# Patient Record
Sex: Female | Born: 1990
Health system: Southern US, Community
[De-identification: ages and names within clinical notes are randomized; demographics above are authoritative.]

## PROBLEM LIST (undated history)

## (undated) DIAGNOSIS — N390 Urinary tract infection, site not specified: Secondary | ICD-10-CM

## (undated) DIAGNOSIS — O4400 Placenta previa specified as without hemorrhage, unspecified trimester: Secondary | ICD-10-CM

## (undated) DIAGNOSIS — F419 Anxiety disorder, unspecified: Secondary | ICD-10-CM

## (undated) DIAGNOSIS — J302 Other seasonal allergic rhinitis: Secondary | ICD-10-CM

## (undated) DIAGNOSIS — M419 Scoliosis, unspecified: Secondary | ICD-10-CM

## (undated) DIAGNOSIS — F32A Depression, unspecified: Secondary | ICD-10-CM

## (undated) HISTORY — DX: Scoliosis, unspecified: M41.9

## (undated) HISTORY — PX: WISDOM TOOTH EXTRACTION: SHX21

## (undated) HISTORY — DX: Other seasonal allergic rhinitis: J30.2

## (undated) HISTORY — DX: Depression, unspecified: F32.A

## (undated) HISTORY — DX: Anxiety disorder, unspecified: F41.9

## (undated) HISTORY — DX: Urinary tract infection, site not specified: N39.0

---

## 2014-04-12 HISTORY — PX: SPINAL FUSION: SHX223

## 2016-07-01 ENCOUNTER — Telehealth: Payer: Self-pay | Admitting: Behavioral Health

## 2016-07-01 NOTE — Telephone Encounter (Signed)
Patient voiced that she was driving at this time and unsure of some things like medications. She will be bringing in the new patient packet tomorrow at her appointment.

## 2016-07-02 ENCOUNTER — Encounter: Payer: Self-pay | Admitting: Family Medicine

## 2016-07-02 ENCOUNTER — Ambulatory Visit (INDEPENDENT_AMBULATORY_CARE_PROVIDER_SITE_OTHER): Payer: Managed Care, Other (non HMO) | Admitting: Family Medicine

## 2016-07-02 VITALS — BP 115/73 | HR 82 | Temp 98.2°F | Ht 65.25 in | Wt 147.0 lb

## 2016-07-02 DIAGNOSIS — Z711 Person with feared health complaint in whom no diagnosis is made: Secondary | ICD-10-CM | POA: Diagnosis not present

## 2016-07-02 DIAGNOSIS — G8929 Other chronic pain: Secondary | ICD-10-CM | POA: Diagnosis not present

## 2016-07-02 DIAGNOSIS — Z9889 Other specified postprocedural states: Secondary | ICD-10-CM

## 2016-07-02 DIAGNOSIS — M546 Pain in thoracic spine: Secondary | ICD-10-CM | POA: Diagnosis not present
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
## 2016-07-02 NOTE — Patient Instructions (Signed)
I will refer you to [facility] ([facility]) to give an opinion about your back. Please see if you can get your MRI and recent x-rays on a CD prior to your appointment At this time I do not see anything of concern in your breast, but certainly keep an eye on it. It if seems to be changing or otherwise has you concerned I am order imaging of the area  Take care and nice to meet you today!

## 2016-09-04 ENCOUNTER — Encounter: Payer: Self-pay | Admitting: Physical Medicine & Rehabilitation

## 2016-09-22 ENCOUNTER — Telehealth: Payer: Self-pay | Admitting: Family Medicine

## 2016-09-22 MED ORDER — PANTOPRAZOLE SODIUM 20 MG PO TBEC: 20.0000 mg | DELAYED_RELEASE_TABLET | Freq: Every day | ORAL | 5 refills | Status: DC

## 2016-09-22 MED ORDER — LEVONORGEST-ETH ESTRAD 91-DAY 0.15-0.03 MG PO TABS: 1.0000 | ORAL_TABLET | Freq: Every day | ORAL | 3 refills | Status: DC

## 2016-09-22 MED ORDER — TRAMADOL HCL 50 MG PO TABS: 50.0000 mg | ORAL_TABLET | Freq: Two times a day (BID) | ORAL | 2 refills | Status: DC | PRN

## 2016-09-22 MED ORDER — METHOCARBAMOL 750 MG PO TABS: 750.0000 mg | ORAL_TABLET | Freq: Three times a day (TID) | ORAL | 5 refills | Status: DC

## 2016-09-22 NOTE — Telephone Encounter (Signed)
   traMADol, pantoprazole, levonorgestrel  Last seen 07/02/16 Never filled by you  Please advise PC

## 2016-09-22 NOTE — Telephone Encounter (Signed)
Pt says that she need a Rx refill for traMADol, pantoprazole, levonorgestrel     Pharmacy: [id number] - [place], [place] - [address]

## 2016-09-22 NOTE — Addendum Note (Signed)
Addended by: Abbe AmsterdamOPLAND, JESSICA C on: 09/22/2016 05:48 PM   Modules accepted: Orders

## 2016-09-22 NOTE — Telephone Encounter (Signed)
Called her to get estimate of monthly use of robaxin/ tramadol

## 2016-10-30 ENCOUNTER — Ambulatory Visit: Payer: Managed Care, Other (non HMO) | Admitting: Medical

## 2016-10-31 ENCOUNTER — Ambulatory Visit: Payer: Managed Care, Other (non HMO) | Admitting: Medical

## 2017-01-19 ENCOUNTER — Encounter: Payer: Self-pay | Admitting: Family Medicine

## 2017-01-19 ENCOUNTER — Telehealth: Payer: Self-pay | Admitting: Family Medicine

## 2017-01-19 DIAGNOSIS — G8929 Other chronic pain: Secondary | ICD-10-CM

## 2017-01-19 DIAGNOSIS — M546 Pain in thoracic spine: Principal | ICD-10-CM

## 2017-01-19 NOTE — Telephone Encounter (Signed)
Relation to ZO:XWRUpt:self Call back number:[phone]   Reason for call:  Patient states PCP referred her to Physical Medicine and Rehabilitation and due to school patient was unable to follow up with specialist.  Patient requesting referral and has a scheduled follow up with PCP 01/28/2017 at 12:30pm

## 2017-01-28 ENCOUNTER — Encounter: Payer: Self-pay | Admitting: Family Medicine

## 2017-01-28 ENCOUNTER — Other Ambulatory Visit (HOSPITAL_COMMUNITY)
Admission: RE | Admit: 2017-01-28 | Discharge: 2017-01-28 | Disposition: A | Discharge status: Home / Self Care | Payer: Managed Care, Other (non HMO) | Source: Ambulatory Visit | Attending: Family Medicine | Admitting: Family Medicine

## 2017-01-28 ENCOUNTER — Ambulatory Visit (INDEPENDENT_AMBULATORY_CARE_PROVIDER_SITE_OTHER): Payer: Managed Care, Other (non HMO) | Admitting: Family Medicine

## 2017-01-28 VITALS — BP 124/84 | HR 90 | Temp 98.1°F | Ht 65.25 in | Wt 151.2 lb

## 2017-01-28 DIAGNOSIS — Z01419 Encounter for gynecological examination (general) (routine) without abnormal findings: Secondary | ICD-10-CM | POA: Diagnosis present

## 2017-01-28 DIAGNOSIS — Z13220 Encounter for screening for lipoid disorders: Secondary | ICD-10-CM

## 2017-01-28 DIAGNOSIS — Z124 Encounter for screening for malignant neoplasm of cervix: Secondary | ICD-10-CM | POA: Diagnosis not present

## 2017-01-28 DIAGNOSIS — Z1329 Encounter for screening for other suspected endocrine disorder: Secondary | ICD-10-CM | POA: Diagnosis not present

## 2017-01-28 DIAGNOSIS — Z131 Encounter for screening for diabetes mellitus: Secondary | ICD-10-CM

## 2017-01-28 DIAGNOSIS — Z7189 Other specified counseling: Secondary | ICD-10-CM

## 2017-01-28 DIAGNOSIS — Z7184 Encounter for health counseling related to travel: Secondary | ICD-10-CM

## 2017-01-28 DIAGNOSIS — Z113 Encounter for screening for infections with a predominantly sexual mode of transmission: Secondary | ICD-10-CM | POA: Diagnosis present

## 2017-01-28 DIAGNOSIS — Z130 Encounter for screening for diseases of the blood and blood-forming organs and certain disorders involving the immune mechanism: Secondary | ICD-10-CM | POA: Diagnosis not present

## 2017-01-28 LAB — CBC
HCT: 41.1 % (ref 36.0–46.0)
HEMOGLOBIN: 13.7 g/dL (ref 12.0–15.0)
MCHC: 33.4 g/dL (ref 30.0–36.0)
MCV: 96.2 fl (ref 78.0–100.0)
PLATELETS: 317 10*3/uL (ref 150.0–400.0)
RBC: 4.27 Mil/uL (ref 3.87–5.11)
RDW: 12.9 % (ref 11.5–15.5)
WBC: 5.9 10*3/uL (ref 4.0–10.5)

## 2017-01-28 LAB — LIPID PANEL
CHOL/HDL RATIO: 2
CHOLESTEROL: 135 mg/dL (ref 0–200)
HDL: 58.2 mg/dL (ref >39.00)
LDL CALC: 47 mg/dL (ref 0–99)
NonHDL: 77.2
TRIGLYCERIDES: 152 mg/dL — AB (ref 0.0–149.0)
VLDL: 30.4 mg/dL (ref 0.0–40.0)

## 2017-01-28 LAB — COMPREHENSIVE METABOLIC PANEL
ALK PHOS: 33 U/L — AB (ref 39–117)
ALT: 18 U/L (ref 0–35)
AST: 19 U/L (ref 0–37)
Albumin: 4.3 g/dL (ref 3.5–5.2)
BILIRUBIN TOTAL: 0.4 mg/dL (ref 0.2–1.2)
BUN: 12 mg/dL (ref 6–23)
CALCIUM: 9.8 mg/dL (ref 8.4–10.5)
CO2: 27 meq/L (ref 19–32)
CREATININE: 0.75 mg/dL (ref 0.40–1.20)
Chloride: 106 mEq/L (ref 96–112)
GFR: 99.77 mL/min (ref >60.00)
Glucose, Bld: 102 mg/dL — ABNORMAL HIGH (ref 70–99)
Potassium: 4.5 mEq/L (ref 3.5–5.1)
Sodium: 140 mEq/L (ref 135–145)
TOTAL PROTEIN: 7.1 g/dL (ref 6.0–8.3)

## 2017-01-28 LAB — TSH: TSH: 1.03 u[IU]/mL (ref 0.35–4.50)

## 2017-01-28 MED ORDER — TYPHOID VACCINE PO CPDR: 1.0000 | DELAYED_RELEASE_CAPSULE | ORAL | 0 refills | Status: DC

## 2017-01-28 NOTE — Patient Instructions (Addendum)
I will be in touch with your pap and other labs asap I hope that your wedding and honeymoon are wonderful!!  Please obtain your shot records if you are able- I am glad to look them over for you.  We want to know if you have had 3 hepatitis B shots and 2 hepatitis A shots. If you have not, you should consider having at least one dose prior to your trip Also, you may want to use the oral typhoid vaccine that I gave you today.  This is recommended by the CDC but is your decision It does not appear that you will be traveling to an area with malaria, but if plans change or you do want medication anyway just let me know   Also, please find out if you have had a tetanus shot in the last 10 years.  It not it would be wise to have a booster prior to your trip

## 2017-01-28 NOTE — Progress Notes (Signed)
Pre visit review using our clinic review tool, if applicable. No additional management support is needed unless otherwise documented below in the visit note. 

## 2017-01-28 NOTE — Progress Notes (Signed)
[facility] at [facility] [address], [address] [place], [place] [address] [phone]  Date:  01/28/2017   Name:  Amy Hancock   DOB:  12-04-1990   MRN:  [id number]  PCP:  Abbe Amsterdam, MD    Chief Complaint: Gynecologic Exam (Pt will PAP smear. )   History of Present Illness:  Amy Hancock is a 26 y.o. very pleasant female patient who presents with the following:  Here today for a recheck visit and pap  She is getting married next month Her last pap was a few years ago- she is not sure of the exact date. Never had any abnl pap that she can recall LMP was last month- she is on 3 month continuous OCP  She is going to [place] for her honeymoon - [place] will be there for 2 weeks, [place]. She wants to see if she needs any immunizations prior to her trip She did not have her childhood shots in [place] and is not really sure where her records may be. Unsure of date of last tetanus or if she had hep A vaccine. We do think that she had hep B series  Reviewed CDC website- she does not plan to visit any areas with malaria according to their map of [place]  She is not fasting today Would like to have other screening labs as well- has not had any BW in years Would like a flu shot today  There are no active problems to display for this patient.   Past Medical History:  Diagnosis Date  . Scoliosis   . Seasonal allergies     Past Surgical History:  Procedure Laterality Date  . SPINAL FUSION  04/12/2014  . WISDOM TOOTH EXTRACTION      Social History  Substance Use Topics  . Smoking status: Never Smoker  . Smokeless tobacco: Never Used  . Alcohol use Yes     Comment: social     Family History  Problem Relation Age of Onset  . Arthritis Mother   . Hypertension Mother   . Bipolar disorder Father   . Healthy Brother   . Arthritis Maternal Grandmother   . Arthritis Maternal Grandfather   . Prostate cancer  Maternal Grandfather   . Hyperlipidemia Maternal Grandfather   . Stroke Maternal Grandfather   . Arthritis Paternal Grandmother   . Diabetes Paternal Grandmother   . Arthritis Paternal Grandfather   . Colon cancer Paternal Grandfather   . Alcoholism Paternal Uncle   . Arthritis Paternal Uncle     No Known Allergies  Medication list has been reviewed and updated.  Current Outpatient Prescriptions on File Prior to Visit  Medication Sig Dispense Refill  . levonorgestrel-ethinyl estradiol (SEASONALE,INTROVALE,JOLESSA) 0.15-0.03 MG tablet Take 1 tablet by mouth daily. 1 Package 3  . loratadine (CLARITIN) 10 MG tablet Take 10 mg by mouth daily.    . methocarbamol (ROBAXIN) 750 MG tablet Take 1 tablet (750 mg total) by mouth 3 (three) times daily. Use as needed for back pain 60 tablet 5  . Multiple Vitamin (MULTIVITAMIN) tablet Take 1 tablet by mouth daily.    Marland Kitchen OVER THE COUNTER MEDICATION Take 1 capsule by mouth daily. HUM (Big Chill)    . OVER THE COUNTER MEDICATION Take 3 capsules by mouth daily. Runway Ready    . pantoprazole (PROTONIX) 20 MG tablet Take 1 tablet (20 mg total) by mouth daily. 30 tablet 5  . Probiotic  Product (PROBIOTIC DAILY PO) Take by mouth. Take one capsule by mouth daily.    . traMADol (ULTRAM) 50 MG tablet Take 1 tablet (50 mg total) by mouth every 12 (twelve) hours as needed. Use for pain 60 tablet 2   No current facility-administered medications on file prior to visit.     Review of Systems:  As per HPI- otherwise negative.  No fever, chills, CP,SOB, nausea, vomiting, diarrhea, rash, cough, ST   Physical Examination: Vitals:   01/28/17 1223  BP: 124/84  Pulse: 90  Temp: 98.1 F (36.7 C)   Vitals:   01/28/17 1223  Weight: 151 lb 3.2 oz (68.6 kg)  Height: 5' 5.25" (1.657 m)   Body mass index is 24.97 kg/m. Ideal Body Weight: Weight in (lb) to have BMI = 25: 151.1  GEN: WDWN, NAD, Non-toxic, A & O x 3, looks well and very healthy  HEENT:  Atraumatic, Normocephalic. Neck supple. No masses, No LAD.  Bilateral TM wnl, oropharynx normal.  PEERL,EOMI.   Ears and Nose: No external deformity. CV: RRR, No M/G/R. No JVD. No thrill. No extra heart sounds. PULM: CTA B, no wheezes, crackles, rhonchi. No retractions. No resp. distress. No accessory muscle use. ABD: S, NT, ND EXTR: No c/c/e NEURO Normal gait.  PSYCH: Normally interactive. Conversant. Not depressed or anxious appearing.  Calm demeanor.  Pelvic: normal, no vaginal lesions or discharge. Uterus normal, no CMT, no adnexal tendereness or masses    Assessment and Plan: Screening for cervical cancer - Plan: Cytology - PAP  Travel advice encounter - Plan: typhoid (VIVOTIF) DR capsule  Screening for deficiency anemia - Plan: CBC  Screening for thyroid disorder - Plan: TSH  Screening for diabetes mellitus - Plan: Comprehensive metabolic panel  Screening for hyperlipidemia - Plan: Lipid panel  Here today for a pap and other services as above Will plan further follow- up pending labs. Discussed CDC recommendations for [place] and gave rx for oral typhoid vac See patient instructions for more details.     Signed Abbe AmsterdamJessica Copland, MD

## 2017-01-30 LAB — CYTOLOGY - PAP
Chlamydia: NEGATIVE
DIAGNOSIS: NEGATIVE
Neisseria Gonorrhea: NEGATIVE

## 2017-02-27 ENCOUNTER — Ambulatory Visit: Payer: Managed Care, Other (non HMO) | Admitting: Physical Medicine & Rehabilitation

## 2017-04-19 ENCOUNTER — Other Ambulatory Visit: Payer: Self-pay | Admitting: Family Medicine

## 2017-04-29 NOTE — Progress Notes (Deleted)
[facility] [place] [address] [place], [place] [phone] [phone]  Date:  04/30/2017   Name:  Amy Hancock   DOB:  10-13-1991   MRN:  [id number]  PCP:  Pearline Cablesopland, Jessica C, MD    Chief Complaint: No chief complaint on file.   History of Present Illness:  Amy Hancock is a 26 y.o. very pleasant female patient who presents with the following:  Healthy young woman who I saw for a travel advice encounter in March- she was planning on a honeymoon in [place] in April  Here today with concern of   There are no active problems to display for this patient.   Past Medical History:  Diagnosis Date  . Scoliosis   . Seasonal allergies     Past Surgical History:  Procedure Laterality Date  . SPINAL FUSION  04/12/2014  . WISDOM TOOTH EXTRACTION      Social History  Substance Use Topics  . Smoking status: Never Smoker  . Smokeless tobacco: Never Used  . Alcohol use Yes     Comment: social     Family History  Problem Relation Age of Onset  . Arthritis Mother   . Hypertension Mother   . Bipolar disorder Father   . Healthy Brother   . Arthritis Maternal Grandmother   . Arthritis Maternal Grandfather   . Prostate cancer Maternal Grandfather   . Hyperlipidemia Maternal Grandfather   . Stroke Maternal Grandfather   . Arthritis Paternal Grandmother   . Diabetes Paternal Grandmother   . Arthritis Paternal Grandfather   . Colon cancer Paternal Grandfather   . Alcoholism Paternal Uncle   . Arthritis Paternal Uncle     No Known Allergies  Medication list has been reviewed and updated.  Current Outpatient Prescriptions on File Prior to Visit  Medication Sig Dispense Refill  . levonorgestrel-ethinyl estradiol (SEASONALE,INTROVALE,JOLESSA) 0.15-0.03 MG tablet Take 1 tablet by mouth daily. 1 Package 3  . loratadine (CLARITIN) 10 MG tablet Take 10 mg by mouth daily.    . methocarbamol (ROBAXIN) 750 MG tablet  Take 1 tablet (750 mg total) by mouth 3 (three) times daily. Use as needed for back pain 60 tablet 5  . Multiple Vitamin (MULTIVITAMIN) tablet Take 1 tablet by mouth daily.    Marland Kitchen. OVER THE COUNTER MEDICATION Take 1 capsule by mouth daily. HUM (Big Chill)    . OVER THE COUNTER MEDICATION Take 3 capsules by mouth daily. Runway Ready    . pantoprazole (PROTONIX) 20 MG tablet TAKE 1 TABLET EVERY DAY 30 tablet 5  . Probiotic Product (PROBIOTIC DAILY PO) Take by mouth. Take one capsule by mouth daily.    . traMADol (ULTRAM) 50 MG tablet Take 1 tablet (50 mg total) by mouth every 12 (twelve) hours as needed. Use for pain 60 tablet 2  . typhoid (VIVOTIF) DR capsule Take 1 capsule by mouth every other day. Take for 4 doses 4 capsule 0   No current facility-administered medications on file prior to visit.     Review of Systems:  ***  Physical Examination: There were no vitals filed for this visit. There were no vitals filed for this visit. There is no height or weight on file to calculate BMI. Ideal Body Weight:    ***  Assessment and Plan: ***  Signed Abbe AmsterdamJessica Copland, MD

## 2017-04-30 ENCOUNTER — Ambulatory Visit: Payer: Managed Care, Other (non HMO) | Admitting: Family Medicine

## 2017-04-30 ENCOUNTER — Telehealth: Payer: Self-pay | Admitting: Family Medicine

## 2017-04-30 NOTE — Telephone Encounter (Signed)
Patient lvm at 7:45am cancelling her 10am appointment today due to work conflict, charge or no charge

## 2017-04-30 NOTE — Telephone Encounter (Signed)
No charge. 

## 2017-05-05 ENCOUNTER — Other Ambulatory Visit (HOSPITAL_COMMUNITY)
Admission: RE | Admit: 2017-05-05 | Discharge: 2017-05-05 | Disposition: A | Discharge status: Home / Self Care | Payer: Managed Care, Other (non HMO) | Source: Ambulatory Visit | Attending: Family Medicine | Admitting: Family Medicine

## 2017-05-05 ENCOUNTER — Ambulatory Visit (INDEPENDENT_AMBULATORY_CARE_PROVIDER_SITE_OTHER): Payer: Managed Care, Other (non HMO) | Admitting: Family Medicine

## 2017-05-05 ENCOUNTER — Encounter: Payer: Self-pay | Admitting: Family Medicine

## 2017-05-05 VITALS — BP 110/72 | HR 74 | Temp 98.0°F | Resp 18 | Wt 152.6 lb

## 2017-05-05 DIAGNOSIS — R300 Dysuria: Secondary | ICD-10-CM | POA: Insufficient documentation

## 2017-05-05 DIAGNOSIS — N390 Urinary tract infection, site not specified: Secondary | ICD-10-CM | POA: Diagnosis not present

## 2017-05-05 DIAGNOSIS — R350 Frequency of micturition: Secondary | ICD-10-CM | POA: Diagnosis not present

## 2017-05-05 MED ORDER — FLUCONAZOLE 150 MG PO TABS: 150.0000 mg | ORAL_TABLET | ORAL | 0 refills | Status: DC

## 2017-05-05 MED ORDER — SULFAMETHOXAZOLE-TRIMETHOPRIM 800-160 MG PO TABS: 1.0000 | ORAL_TABLET | Freq: Two times a day (BID) | ORAL | 0 refills | Status: DC

## 2017-05-05 NOTE — Patient Instructions (Signed)
Probiotics and cranberry tabs and 64 oz of clear fluids Urinary Tract Infection, Adult A urinary tract infection (UTI) is an infection of any part of the urinary tract. The urinary tract includes the:  Kidneys.  Ureters.  Bladder.  Urethra.  These organs make, store, and get rid of pee (urine) in the body. Follow these instructions at home:  Take over-the-counter and prescription medicines only as told by your doctor.  If you were prescribed an antibiotic medicine, take it as told by your doctor. Do not stop taking the antibiotic even if you start to feel better.  Avoid the following drinks: ? Alcohol. ? Caffeine. ? Tea. ? Carbonated drinks.  Drink enough fluid to keep your pee clear or pale yellow.  Keep all follow-up visits as told by your doctor. This is important.  Make sure to: ? Empty your bladder often and completely. Do not to hold pee for long periods of time. ? Empty your bladder before and after sex. ? Wipe from front to back after a bowel movement if you are female. Use each tissue one time when you wipe. Contact a doctor if:  You have back pain.  You have a fever.  You feel sick to your stomach (nauseous).  You throw up (vomit).  Your symptoms do not get better after 3 days.  Your symptoms go away and then come back. Get help right away if:  You have very bad back pain.  You have very bad lower belly (abdominal) pain.  You are throwing up and cannot keep down any medicines or water. This information is not intended to replace advice given to you by your health care provider. Make sure you discuss any questions you have with your health care provider. Document Released: 04/28/2008 Document Revised: 04/17/2016 Document Reviewed: 10/01/2015 Elsevier Interactive Patient Education  [employer]2018 [employer]

## 2017-05-05 NOTE — Progress Notes (Signed)
Subjective:  I acted as a Neurosurgeonscribe for Dr. Abner GreenspanBlyth. Amy Hancock, [place]  Patient ID: [name], female    DOB: 03-09-1991, 26 y.o.   MRN: [id number]  No chief complaint on file.   HPI  Patient is in today for an acute uti. She states she has used Macrobid and no relief. She states she still feels a discharge, and dryness. She also states she has more frequency and dysuria than normal as well as urgency. She denies blood in the urine. No recent febrile illness or acute hospitalizations. Denies CP/palp/SOB/HA/congestion/fevers/GI or GU c/o. Taking meds as prescribed    Patient Care Team: Copland, Gwenlyn FoundJessica C, MD as PCP - General (Family Medicine)   Past Medical History:  Diagnosis Date  . Scoliosis   . Seasonal allergies   . Urinary tract infection 05/09/2017    Past Surgical History:  Procedure Laterality Date  . SPINAL FUSION  04/12/2014  . WISDOM TOOTH EXTRACTION      Family History  Problem Relation Age of Onset  . Arthritis Mother   . Hypertension Mother   . Bipolar disorder Father   . Healthy Brother   . Arthritis Maternal Grandmother   . Arthritis Maternal Grandfather   . Prostate cancer Maternal Grandfather   . Hyperlipidemia Maternal Grandfather   . Stroke Maternal Grandfather   . Arthritis Paternal Grandmother   . Diabetes Paternal Grandmother   . Arthritis Paternal Grandfather   . Colon cancer Paternal Grandfather   . Alcoholism Paternal Uncle   . Arthritis Paternal Uncle     Social History   Social History  . Marital status: Single    Spouse name: N/A  . Number of children: N/A  . Years of education: N/A   Occupational History  . Not on file.   Social History Main Topics  . Smoking status: Never Smoker  . Smokeless tobacco: Never Used  . Alcohol use Yes     Comment: social   . Drug use: No  . Sexual activity: Not on file   Other Topics Concern  . Not on file   Social History Narrative  . No narrative on file    Outpatient Medications Prior  to Visit  Medication Sig Dispense Refill  . levonorgestrel-ethinyl estradiol (SEASONALE,INTROVALE,JOLESSA) 0.15-0.03 MG tablet Take 1 tablet by mouth daily. 1 Package 3  . loratadine (CLARITIN) 10 MG tablet Take 10 mg by mouth daily.    . methocarbamol (ROBAXIN) 750 MG tablet Take 1 tablet (750 mg total) by mouth 3 (three) times daily. Use as needed for back pain 60 tablet 5  . Multiple Vitamin (MULTIVITAMIN) tablet Take 1 tablet by mouth daily.    Marland Kitchen. OVER THE COUNTER MEDICATION Take 1 capsule by mouth daily. HUM (Big Chill)    . OVER THE COUNTER MEDICATION Take 3 capsules by mouth daily. Runway Ready    . pantoprazole (PROTONIX) 20 MG tablet TAKE 1 TABLET EVERY DAY 30 tablet 5  . Probiotic Product (PROBIOTIC DAILY PO) Take by mouth. Take one capsule by mouth daily.    . traMADol (ULTRAM) 50 MG tablet Take 1 tablet (50 mg total) by mouth every 12 (twelve) hours as needed. Use for pain 60 tablet 2  . typhoid (VIVOTIF) DR capsule Take 1 capsule by mouth every other day. Take for 4 doses 4 capsule 0   No facility-administered medications prior to visit.     No Known Allergies  Review of Systems  Constitutional: Negative for fever and malaise/fatigue.  HENT: Negative  for congestion.   Eyes: Negative for blurred vision.  Respiratory: Negative for cough and shortness of breath.   Cardiovascular: Negative for chest pain, palpitations and leg swelling.  Gastrointestinal: Negative for vomiting.  Genitourinary: Positive for dysuria, frequency and urgency. Negative for hematuria.  Musculoskeletal: Negative for back pain.  Skin: Negative for rash.  Neurological: Negative for loss of consciousness and headaches.       Objective:    Physical Exam  Constitutional: She is oriented to person, place, and time. She appears well-developed and well-nourished. No distress.  HENT:  Head: Normocephalic and atraumatic.  Eyes: Conjunctivae are normal.  Neck: Normal range of motion. No thyromegaly  present.  Cardiovascular: Normal rate and regular rhythm.   Pulmonary/Chest: Effort normal and breath sounds normal. She has no wheezes.  Abdominal: Soft. Bowel sounds are normal. There is no tenderness.  Musculoskeletal: Normal range of motion. She exhibits no edema or deformity.  Neurological: She is alert and oriented to person, place, and time.  Skin: Skin is warm and dry. She is not diaphoretic.  Psychiatric: She has a normal mood and affect.    BP 110/72 (BP Location: Left Arm, Patient Position: Sitting, Cuff Size: Normal)   Pulse 74   Temp 98 F (36.7 C) (Oral)   Resp 18   Wt 152 lb 9.6 oz (69.2 kg)   SpO2 96%   BMI 25.20 kg/m  Wt Readings from Last 3 Encounters:  05/05/17 152 lb 9.6 oz (69.2 kg)  01/28/17 151 lb 3.2 oz (68.6 kg)  07/02/16 147 lb (66.7 kg)   BP Readings from Last 3 Encounters:  05/05/17 110/72  01/28/17 124/84  07/02/16 115/73      There is no immunization history on file for this patient.  Health Maintenance  Topic Date Due  . HIV Screening  09/11/2006  . TETANUS/TDAP  09/11/2010  . INFLUENZA VACCINE  06/24/2017  . PAP SMEAR  01/29/2020    Lab Results  Component Value Date   WBC 5.9 01/28/2017   HGB 13.7 01/28/2017   HCT 41.1 01/28/2017   PLT 317.0 01/28/2017   GLUCOSE 102 (H) 01/28/2017   CHOL 135 01/28/2017   TRIG 152.0 (H) 01/28/2017   HDL 58.20 01/28/2017   LDLCALC 47 01/28/2017   ALT 18 01/28/2017   AST 19 01/28/2017   NA 140 01/28/2017   K 4.5 01/28/2017   CL 106 01/28/2017   CREATININE 0.75 01/28/2017   BUN 12 01/28/2017   CO2 27 01/28/2017   TSH 1.03 01/28/2017    Lab Results  Component Value Date   TSH 1.03 01/28/2017   Lab Results  Component Value Date   WBC 5.9 01/28/2017   HGB 13.7 01/28/2017   HCT 41.1 01/28/2017   MCV 96.2 01/28/2017   PLT 317.0 01/28/2017   Lab Results  Component Value Date   NA 140 01/28/2017   K 4.5 01/28/2017   CO2 27 01/28/2017   GLUCOSE 102 (H) 01/28/2017   BUN 12 01/28/2017     CREATININE 0.75 01/28/2017   BILITOT 0.4 01/28/2017   ALKPHOS 33 (L) 01/28/2017   AST 19 01/28/2017   ALT 18 01/28/2017   PROT 7.1 01/28/2017   ALBUMIN 4.3 01/28/2017   CALCIUM 9.8 01/28/2017   GFR 99.77 01/28/2017   Lab Results  Component Value Date   CHOL 135 01/28/2017   Lab Results  Component Value Date   HDL 58.20 01/28/2017   Lab Results  Component Value Date   LDLCALC 47 01/28/2017  Lab Results  Component Value Date   TRIG 152.0 (H) 01/28/2017   Lab Results  Component Value Date   CHOLHDL 2 01/28/2017   No results found for: HGBA1C       Assessment & Plan:   Problem List Items Addressed This Visit    Urinary tract infection    Recently treated with Macrobid bid at an urgent care but unfortunately has not responded and continues to struggle with dysuria. Repeat UA c and s and treat with Bactrim DS bid, cranberry tabs and probiotics.       Relevant Medications   sulfamethoxazole-trimethoprim (BACTRIM DS,SEPTRA DS) 800-160 MG tablet   fluconazole (DIFLUCAN) 150 MG tablet    Other Visit Diagnoses    Frequency of urination    -  Primary   Relevant Orders   Urine Culture (Completed)   Urine cytology ancillary only   POCT Urinalysis Dipstick (Automated) (Completed)   Dysuria       Relevant Orders   Urine Culture (Completed)   Urine cytology ancillary only   POCT Urinalysis Dipstick (Automated) (Completed)      I am having Ms. Hancock start on sulfamethoxazole-trimethoprim and fluconazole. I am also having her maintain her multivitamin, loratadine, Probiotic Product (PROBIOTIC DAILY PO), OVER THE COUNTER MEDICATION, OVER THE COUNTER MEDICATION, levonorgestrel-ethinyl estradiol, methocarbamol, traMADol, typhoid, and pantoprazole.  Meds ordered this encounter  Medications  . sulfamethoxazole-trimethoprim (BACTRIM DS,SEPTRA DS) 800-160 MG tablet    Sig: Take 1 tablet by mouth 2 (two) times daily.    Dispense:  10 tablet    Refill:  0  . fluconazole  (DIFLUCAN) 150 MG tablet    Sig: Take 1 tablet (150 mg total) by mouth once a week.    Dispense:  2 tablet    Refill:  0    CMA served as scribe during this visit. History, Physical and Plan performed by medical provider. Documentation and orders reviewed and attested to.  Danise Edge, MD

## 2017-05-07 LAB — POC URINALSYSI DIPSTICK (AUTOMATED)
Bilirubin, UA: NEGATIVE
Blood, UA: NEGATIVE
Glucose, UA: NEGATIVE
Ketones, UA: NEGATIVE
LEUKOCYTES UA: NEGATIVE
Nitrite, UA: NEGATIVE
PH UA: 6 (ref 5.0–8.0)
PROTEIN UA: NEGATIVE
SPEC GRAV UA: 1.01 (ref 1.010–1.025)
UROBILINOGEN UA: 0.2 U/dL

## 2017-05-09 ENCOUNTER — Encounter: Payer: Self-pay | Admitting: Family Medicine

## 2017-05-09 DIAGNOSIS — N390 Urinary tract infection, site not specified: Secondary | ICD-10-CM | POA: Insufficient documentation

## 2017-05-09 HISTORY — DX: Urinary tract infection, site not specified: N39.0

## 2017-05-09 LAB — URINE CULTURE

## 2017-05-09 NOTE — Assessment & Plan Note (Signed)
Recently treated with Macrobid bid at an urgent care but unfortunately has not responded and continues to struggle with dysuria. Repeat UA c and s and treat with Bactrim DS bid, cranberry tabs and probiotics.

## 2017-05-11 LAB — URINE CYTOLOGY ANCILLARY ONLY
Bacterial vaginitis: NEGATIVE
Candida vaginitis: NEGATIVE

## 2017-05-13 ENCOUNTER — Telehealth: Payer: Self-pay | Admitting: Family Medicine

## 2017-05-13 NOTE — Telephone Encounter (Signed)
Patient Name: Amy Hancock  DOB: [birth year]    Initial Comment Caller was treated one day last week, finished antibiotic for UTI, still having symptoms has blood in urine.   Nurse Assessment  Nurse: Merlene PullingHammonds, RN, Lissa Date/Time (Eastern Time): 05/13/2017 3:18:18 PM  Confirm and document reason for call. If symptomatic, describe symptoms. ---Caller was treated one day last week, finished antibiotic for UTI, still having symptoms has blood in urine. I can see a little blood in my urine. I am having urgency and burning. I have been really cold and I am usually hot, not sure on fever. Earlier I did have some lower back pain.  Does the patient have any new or worsening symptoms? ---Yes  Will a triage be completed? ---Yes  Related visit to physician within the last 2 weeks? ---Yes  Does the PT have any chronic conditions? (i.e. diabetes, asthma, etc.) ---No  Is the patient pregnant or possibly pregnant? (Ask all females between the ages of 10512-55) ---No  Is this a behavioral health or substance abuse call? ---No     Guidelines    Guideline Title Affirmed Question Affirmed Notes  Urinary Tract Infection on Antibiotic Follow-up Call - Female [1] Taking antibiotic > 72 hours (3 days) for UTI AND [2] painful urination or frequency not improved    Final Disposition User   See Physician within 24 Hours Hammonds, RN, Lissa    Comments  Antibiotic just finished pmp/smx.  Bactrim tmp/pmp was the second round of antibiotic.  This has been going on for 3-4 weeks.  No option for "Acute" appt- called office and office scheduled pt for 11:30am tomorrow.   Referrals  REFERRED TO PCP OFFICE

## 2017-05-14 ENCOUNTER — Other Ambulatory Visit (HOSPITAL_COMMUNITY)
Admission: RE | Admit: 2017-05-14 | Discharge: 2017-05-14 | Disposition: A | Discharge status: Home / Self Care | Payer: Managed Care, Other (non HMO) | Source: Ambulatory Visit | Attending: Family Medicine | Admitting: Family Medicine

## 2017-05-14 ENCOUNTER — Ambulatory Visit (INDEPENDENT_AMBULATORY_CARE_PROVIDER_SITE_OTHER): Payer: Managed Care, Other (non HMO) | Admitting: Family Medicine

## 2017-05-14 VITALS — BP 94/72 | HR 73 | Temp 98.5°F | Ht 65.25 in | Wt 152.0 lb

## 2017-05-14 DIAGNOSIS — N923 Ovulation bleeding: Secondary | ICD-10-CM

## 2017-05-14 DIAGNOSIS — R350 Frequency of micturition: Secondary | ICD-10-CM | POA: Insufficient documentation

## 2017-05-14 LAB — POCT URINALYSIS DIP (MANUAL ENTRY)
Glucose, UA: NEGATIVE mg/dL
Ketones, POC UA: NEGATIVE mg/dL
LEUKOCYTES UA: NEGATIVE
NITRITE UA: NEGATIVE
PH UA: 6 (ref 5.0–8.0)
Protein Ur, POC: NEGATIVE mg/dL
Spec Grav, UA: >=1.03 — AB (ref 1.010–1.025)
Urobilinogen, UA: 4 E.U./dL — AB

## 2017-05-14 LAB — POCT URINE PREGNANCY: Preg Test, Ur: NEGATIVE

## 2017-05-14 NOTE — Patient Instructions (Addendum)
Your urine does not appear to be infected today, but we will repeat your culture for you and I will be in touch with your results asap.  For now let's hold off on another antibiotic I think your spotting is likely due to stress and antibiotic use.  I do not think this is any cause for alarm, but may mean that your birth control pills are not completley controlling your cycle at this time.  To be on the safe side, consider using condoms for a couple of weeks or until you stop spotting.    Please let me know if your urinary symptoms change or get worse while we await your culture results

## 2017-05-14 NOTE — Progress Notes (Signed)
[facility] [place] [address], [address] [place], [place] [address] [phone] [phone]  Date:  05/14/2017   Name:  Amy Hancock   DOB:  1991/10/18   MRN:  [id number]  PCP:  Pearline Cablesopland, Gualberto Wahlen C, MD    Chief Complaint: Urinary Tract Infection (c/o increased urination, burning, lower back pain, spotting x 3-4 weeks.  )   History of Present Illness:  Amy Hancock is a 26 y.o. very pleasant female patient who presents with the following:  Here today for possible persistent UTI and concern of spotting   She called her old PCP and got an rx for presumed UTI on 6/7, macrobid. At that time she had noted sx for about one week and had tried to get rid of it on her own with hydration, cranberry juice.  She finished the abx, did not feel better so she came in to see Dr.B on 6/12; she was started on bactrim that time. Her UTI did show proteus which should have been sensitive to the bactrim.  She has completed the abx again Here today because she is not sure if she might still have some UTI sx   She noted onset of spotting yesterday which also concerned her  She noted some urinary urgency and mild dysuria She may have some occasional back pain, but no fever or vomiting  Her LMP was approx 4/9-  She takes a 3 month OPC and has a menses every 3 months She does not generally get any spotting in between scheduled bleeds  No vaginal symptoms She is just married- no new partners or concern of STI.  She and her new husband had a great [place] and [place] to [place], and they are in the process of buying a house   She regularly uses OTC pain relievers and NSAIDs for her back pain due to longstanding scoliosis   Also mentions some left hip pain that has been present for about 3 months. She is not yet concerned enough to seek evaluation but wanted to mention it to me in case it persists   Results for orders placed or performed in visit on 05/14/17  POCT  urinalysis dipstick  Result Value Ref Range   Color, UA yellow yellow   Clarity, UA clear clear   Glucose, UA negative negative mg/dL   Bilirubin, UA small (A) negative   Ketones, POC UA negative negative mg/dL   Spec Grav, UA >=2.952>=1.030 (A) 1.010 - 1.025   Blood, UA trace-intact (A) negative   pH, UA 6.0 5.0 - 8.0   Protein Ur, POC negative negative mg/dL   Urobilinogen, UA 4.0 (A) 0.2 or 1.0 E.U./dL   Nitrite, UA Negative Negative   Leukocytes, UA Negative Negative     Patient Active Problem List   Diagnosis Date Noted  . Urinary tract infection 05/09/2017    Past Medical History:  Diagnosis Date  . Scoliosis   . Seasonal allergies   . Urinary tract infection 05/09/2017    Past Surgical History:  Procedure Laterality Date  . SPINAL FUSION  04/12/2014  . WISDOM TOOTH EXTRACTION      Social History  Substance Use Topics  . Smoking status: Never Smoker  . Smokeless tobacco: Never Used  . Alcohol use Yes     Comment: social     Family History  Problem Relation Age of Onset  . Arthritis Mother   . Hypertension Mother   . Bipolar disorder Father   .  Healthy Brother   . Arthritis Maternal Grandmother   . Arthritis Maternal Grandfather   . Prostate cancer Maternal Grandfather   . Hyperlipidemia Maternal Grandfather   . Stroke Maternal Grandfather   . Arthritis Paternal Grandmother   . Diabetes Paternal Grandmother   . Arthritis Paternal Grandfather   . Colon cancer Paternal Grandfather   . Alcoholism Paternal Uncle   . Arthritis Paternal Uncle     No Known Allergies  Medication list has been reviewed and updated.  Current Outpatient Prescriptions on File Prior to Visit  Medication Sig Dispense Refill  . fluconazole (DIFLUCAN) 150 MG tablet Take 1 tablet (150 mg total) by mouth once a week. 2 tablet 0  . levonorgestrel-ethinyl estradiol (SEASONALE,INTROVALE,JOLESSA) 0.15-0.03 MG tablet Take 1 tablet by mouth daily. 1 Package 3  . loratadine (CLARITIN) 10 MG  tablet Take 10 mg by mouth daily.    . methocarbamol (ROBAXIN) 750 MG tablet Take 1 tablet (750 mg total) by mouth 3 (three) times daily. Use as needed for back pain 60 tablet 5  . Multiple Vitamin (MULTIVITAMIN) tablet Take 1 tablet by mouth daily.    Marland Kitchen OVER THE COUNTER MEDICATION Take 1 capsule by mouth daily. HUM (Big Chill)    . OVER THE COUNTER MEDICATION Take 3 capsules by mouth daily. Runway Ready    . pantoprazole (PROTONIX) 20 MG tablet TAKE 1 TABLET EVERY DAY 30 tablet 5  . Probiotic Product (PROBIOTIC DAILY PO) Take by mouth. Take one capsule by mouth daily.    Marland Kitchen sulfamethoxazole-trimethoprim (BACTRIM DS,SEPTRA DS) 800-160 MG tablet Take 1 tablet by mouth 2 (two) times daily. 10 tablet 0  . traMADol (ULTRAM) 50 MG tablet Take 1 tablet (50 mg total) by mouth every 12 (twelve) hours as needed. Use for pain 60 tablet 2  . typhoid (VIVOTIF) DR capsule Take 1 capsule by mouth every other day. Take for 4 doses 4 capsule 0   No current facility-administered medications on file prior to visit.     Review of Systems:  As per HPI- otherwise negative.   Physical Examination: Vitals:   05/14/17 1133  BP: 94/72  Pulse: 73  Temp: 98.5 F (36.9 C)   Vitals:   05/14/17 1133  Weight: 152 lb (68.9 kg)  Height: 5' 5.25" (1.657 m)   Body mass index is 25.1 kg/m. Ideal Body Weight: Weight in (lb) to have BMI = 25: 151.1  GEN: WDWN, NAD, Non-toxic, A & O x 3, looks well and healthy HEENT: Atraumatic, Normocephalic. Neck supple. No masses, No LAD. Ears and Nose: No external deformity. CV: RRR, No M/G/R. No JVD. No thrill. No extra heart sounds. PULM: CTA B, no wheezes, crackles, rhonchi. No retractions. No resp. distress. No accessory muscle use. ABD: S, NT, ND, +BS. No rebound. No HSM.  Benign belly EXTR: No c/c/e NEURO Normal gait.  PSYCH: Normally interactive. Conversant. Not depressed or anxious appearing.  Calm demeanor.  Pelvic: normal, no vaginal lesions or discharge; there is  some dark brown blood in vagina, coming from cervix. Uterus normal, no CMT, no adnexal tendereness or masses    BP Readings from Last 3 Encounters:  05/14/17 94/72  05/05/17 110/72  01/28/17 124/84     Results for orders placed or performed in visit on 05/14/17  POCT urinalysis dipstick  Result Value Ref Range   Color, UA yellow yellow   Clarity, UA clear clear   Glucose, UA negative negative mg/dL   Bilirubin, UA small (A) negative  Ketones, POC UA negative negative mg/dL   Spec Grav, UA >=4.696 (A) 1.010 - 1.025   Blood, UA trace-intact (A) negative   pH, UA 6.0 5.0 - 8.0   Protein Ur, POC negative negative mg/dL   Urobilinogen, UA 4.0 (A) 0.2 or 1.0 E.U./dL   Nitrite, UA Negative Negative   Leukocytes, UA Negative Negative  POCT urine pregnancy  Result Value Ref Range   Preg Test, Ur Negative Negative      Assessment and Plan: Urinary frequency - Plan: Cervicovaginal ancillary only, POCT urinalysis dipstick, Urine Culture  Spotting between menses  Here today with urinary frequency and spotting Reassured that spotting is likely due to abx use, stress, travel.  She will use condoms for back- up for a couple of weeks as she does not desire pregnancy Await urine culture prior to starting a 3rd abx- her urine dip does not appear infected   Signed Abbe Amsterdam, MD

## 2017-05-15 LAB — URINE CULTURE: ORGANISM ID, BACTERIA: NO GROWTH

## 2017-05-18 ENCOUNTER — Encounter: Payer: Self-pay | Admitting: Family Medicine

## 2017-05-18 LAB — CERVICOVAGINAL ANCILLARY ONLY
Bacterial vaginitis: NEGATIVE
Candida vaginitis: NEGATIVE

## 2017-06-12 ENCOUNTER — Ambulatory Visit: Payer: Managed Care, Other (non HMO) | Admitting: Medical

## 2017-06-12 ENCOUNTER — Telehealth: Payer: Self-pay | Admitting: Family Medicine

## 2017-06-12 ENCOUNTER — Telehealth: Payer: Self-pay | Admitting: Medical

## 2017-06-12 NOTE — Telephone Encounter (Signed)
Patient Name: Amy KicksHAYLI Amy Hancock  DOB: 1991-10-09    Initial Comment Caller says, wife has reaction to cortisone shots anhr ago, her tongue is swollen, and feels light headed, but no difficulty breathing    Nurse Assessment  Nurse: Amy CooterHenry, Amy Hancock, Amy Hancock Date/Time (Eastern Time): 06/12/2017 10:51:20 AM  Confirm and document reason for call. If symptomatic, describe symptoms. ---Caller states that his wife is having a reaction to a cortisone shot. She has had the shot in the past without problems. Today, within an hour of the shot, she began to have tongue swelling and feeling light headed. He is not with his wife at present.  Does the patient have any new or worsening symptoms? ---Yes  Will a triage be completed? ---Yes  Related visit to physician within the last 2 weeks? ---No  Does the PT have any chronic conditions? (i.e. diabetes, asthma, etc.) ---Yes  List chronic conditions. ---Spinal Fusions,  Is the patient pregnant or possibly pregnant? (Ask all females between the ages of 3712-55) ---No  Is this a behavioral health or substance abuse call? ---No     Guidelines    Guideline Title Affirmed Question Affirmed Notes  Tongue Swelling All other adults with swollen tongue (Exception: tongue swelling is a recurrent problem AND NO swelling at present)    Final Disposition User   Go to ED Now Amy CooterHenry, Amy Hancock, Amy Hancock    Comments  [facility]  Caller states that they have an appointment for her in 30 minutes at the office. I explained to him that swelling of the tongue can be a very serious symptom. He still declined to go to the ER. I called the backlnie and provided this information to FedExChristy  Per Christy, the patient's last name on the record is [name]. The DOB matches up and so does the name of her spouse. She [facility]. Advised her of the symptoms. She verbalized understanding.   Referrals  REFERRED TO PCP OFFICE   Disagree/Comply: Disagree  Disagree/Comply Reason:  Disagree with instructions

## 2017-06-12 NOTE — Telephone Encounter (Signed)
Thanks for giving the correct advise and sending to ED.

## 2017-06-12 NOTE — Telephone Encounter (Signed)
Called patient back.  Given her symptoms strongly advised that she go to the nearest ER now.  She stated understanding and agreed.  Appt with Ramon Dredgedward was cancelled.

## 2017-08-03 ENCOUNTER — Other Ambulatory Visit: Payer: Self-pay | Admitting: Emergency Medicine

## 2017-08-03 ENCOUNTER — Other Ambulatory Visit: Payer: Self-pay | Admitting: Family Medicine

## 2017-08-03 MED ORDER — TRAMADOL HCL 50 MG PO TABS: 50.0000 mg | ORAL_TABLET | Freq: Two times a day (BID) | ORAL | 1 refills | Status: DC | PRN

## 2017-08-03 NOTE — Telephone Encounter (Signed)
Last filled tramadol in April of this year.  Ok to refill today

## 2017-08-03 NOTE — Telephone Encounter (Signed)
Received refill request for Tramadol. Last office visit 05/14/17 and last refill 09/22/2016. Is it ok to refill? Please advise.

## 2017-09-29 ENCOUNTER — Telehealth: Payer: Self-pay | Admitting: Family Medicine

## 2017-09-29 ENCOUNTER — Other Ambulatory Visit: Payer: Self-pay | Admitting: Emergency Medicine

## 2017-09-29 NOTE — Telephone Encounter (Signed)
Self   Refill request for levonorgestrel-ethinyl estradiol (SEASONALE,INTROVALE,JOLESSA) 0.15-0.03 MG tablet [[id number]][[id number]] ENDED    AND also traMADol    Pharmacy: [facility] on [address]     Rock NephewPt says that she is completely out of her medication. PCP is currently out of the office. Forwarded request to DOD

## 2017-09-30 MED ORDER — LEVONORGEST-ETH ESTRAD 91-DAY 0.15-0.03 MG PO TABS: 1.0000 | ORAL_TABLET | Freq: Every day | ORAL | 3 refills | Status: DC

## 2017-09-30 MED ORDER — TRAMADOL HCL 50 MG PO TABS: 50.0000 mg | ORAL_TABLET | Freq: Two times a day (BID) | ORAL | 1 refills | Status: DC | PRN

## 2017-09-30 NOTE — Telephone Encounter (Signed)
Pt uses tramadol for scoliosis pain.  Ok to refill- [facility] but do not think [facility].  She recently married and had a name change

## 2017-10-30 ENCOUNTER — Other Ambulatory Visit: Payer: Self-pay | Admitting: Family Medicine

## 2017-11-05 ENCOUNTER — Other Ambulatory Visit: Payer: Self-pay | Admitting: Emergency Medicine

## 2017-11-05 MED ORDER — PANTOPRAZOLE SODIUM 20 MG PO TBEC: 20.0000 mg | DELAYED_RELEASE_TABLET | Freq: Every day | ORAL | 1 refills | Status: DC

## 2017-11-11 ENCOUNTER — Ambulatory Visit: Payer: Self-pay | Admitting: *Deleted

## 2017-11-11 NOTE — Telephone Encounter (Signed)
Pt   Reports   She   Was  Looking  Out of a   Window  In her  House   When  EMCORWindow   Fell  On her head. It  Fell  approx  1  Foot.  She  Has  A  Headache    She  Did not  Black  Out.   She  Has  A  Headache   And  Is   Slightly  Nauseated  And  Is   Slightly Dizzy  When  She  Stands  Up.  Pt  Advised  To   Proceed  To  Er   Have  Someone drive .   She  States  She  Will  Call her father.  Rena  [facility]  Notified  Agrees.     Reason for Disposition . Dangerous injury (e.g., MVA, diving, trampoline, contact sports, fall > 10 feet or 3 meters) or severe blow from hard object (e.g., golf club or baseball bat)  Answer Assessment - Initial Assessment Questions 1. MECHANISM: "How did the injury happen?" For falls, ask: "What height did you fall from?" and "What surface did you fall against?"      Window   Fell   approx   1  Foot  And  Hit  Head    2. ONSET: "When did the injury happen?" (Minutes or hours ago)        1  Hr ago 3. NEUROLOGIC SYMPTOMS: "Was there any loss of consciousness?" "Are there any other neurological symptoms?"         Saw  Stars    Did  Not pass  Out   4. MENTAL STATUS: "Does the person know who he is, who you are, and where he is?"        normal 5. LOCATION: "What part of the head was hit?"       Front  Above  Forehead   6. SCALP APPEARANCE: "What does the scalp look like? Is it bleeding now?" If so, ask: "Is it difficult to stop?"       Appears  normal 7. SIZE: For cuts, bruises, or swelling, ask: "How large is it?" (e.g., inches or centimeters)         N/A 8. PAIN: "Is there any pain?" If so, ask: "How bad is it?"  (e.g., Scale 1-10; or mild, moderate, severe)     7  MODERATE 9. TETANUS: For any breaks in the skin, ask: "When was the last tetanus booster?"     n/A 10. OTHER SYMPTOMS: "Do you have any other symptoms?" (e.g., neck pain, vomiting)       DIZZY   NAUSEATED    11. PREGNANCY: "Is there any chance you are pregnant?" "When was your last menstrual  period?"      OCT    EXTENDED   BIRTH  CONTROL  Protocols used: HEAD INJURY-A-AH

## 2017-12-01 ENCOUNTER — Ambulatory Visit: Payer: 59 | Admitting: Family Medicine

## 2018-04-25 ENCOUNTER — Other Ambulatory Visit: Payer: Self-pay | Admitting: Family Medicine

## 2018-05-29 ENCOUNTER — Other Ambulatory Visit: Payer: Self-pay | Admitting: Family Medicine

## 2018-06-24 ENCOUNTER — Other Ambulatory Visit: Payer: Self-pay | Admitting: Family Medicine

## 2018-09-20 ENCOUNTER — Encounter: Payer: Self-pay | Admitting: Family Medicine

## 2018-09-20 ENCOUNTER — Other Ambulatory Visit: Payer: Self-pay | Admitting: Family Medicine

## 2018-09-20 NOTE — Telephone Encounter (Signed)
Dr Patsy Lager -- please advise seasonale request. Pt last seen by you 05/14/17 and has no future appointments scheduled.

## 2018-09-21 ENCOUNTER — Other Ambulatory Visit: Payer: Self-pay | Admitting: Orthopaedic Surgery

## 2018-09-21 DIAGNOSIS — S32020A Wedge compression fracture of second lumbar vertebra, initial encounter for closed fracture: Secondary | ICD-10-CM

## 2018-09-30 ENCOUNTER — Ambulatory Visit
Admission: RE | Admit: 2018-09-30 | Discharge: 2018-09-30 | Disposition: A | Discharge status: Home / Self Care | Payer: 59 | Source: Ambulatory Visit | Attending: Orthopaedic Surgery | Admitting: Orthopaedic Surgery

## 2018-09-30 DIAGNOSIS — S32020A Wedge compression fracture of second lumbar vertebra, initial encounter for closed fracture: Secondary | ICD-10-CM

## 2019-07-04 DIAGNOSIS — N393 Stress incontinence (female) (male): Secondary | ICD-10-CM | POA: Diagnosis not present

## 2019-07-04 DIAGNOSIS — R102 Pelvic and perineal pain: Secondary | ICD-10-CM | POA: Diagnosis not present

## 2019-07-04 DIAGNOSIS — R350 Frequency of micturition: Secondary | ICD-10-CM | POA: Diagnosis not present

## 2019-08-08 DIAGNOSIS — L308 Other specified dermatitis: Secondary | ICD-10-CM | POA: Diagnosis not present

## 2019-08-08 DIAGNOSIS — R102 Pelvic and perineal pain: Secondary | ICD-10-CM | POA: Diagnosis not present

## 2019-08-08 DIAGNOSIS — M6281 Muscle weakness (generalized): Secondary | ICD-10-CM | POA: Diagnosis not present

## 2019-08-08 DIAGNOSIS — L728 Other follicular cysts of the skin and subcutaneous tissue: Secondary | ICD-10-CM | POA: Diagnosis not present

## 2019-08-08 DIAGNOSIS — R350 Frequency of micturition: Secondary | ICD-10-CM | POA: Diagnosis not present

## 2019-08-08 DIAGNOSIS — D225 Melanocytic nevi of trunk: Secondary | ICD-10-CM | POA: Diagnosis not present

## 2019-08-08 DIAGNOSIS — M62838 Other muscle spasm: Secondary | ICD-10-CM | POA: Diagnosis not present

## 2019-08-08 DIAGNOSIS — Z1283 Encounter for screening for malignant neoplasm of skin: Secondary | ICD-10-CM | POA: Diagnosis not present

## 2019-08-16 DIAGNOSIS — N3946 Mixed incontinence: Secondary | ICD-10-CM | POA: Diagnosis not present

## 2019-08-16 DIAGNOSIS — M6289 Other specified disorders of muscle: Secondary | ICD-10-CM | POA: Diagnosis not present

## 2019-08-16 DIAGNOSIS — M62838 Other muscle spasm: Secondary | ICD-10-CM | POA: Diagnosis not present

## 2019-08-16 DIAGNOSIS — M6281 Muscle weakness (generalized): Secondary | ICD-10-CM | POA: Diagnosis not present

## 2019-08-22 DIAGNOSIS — M62838 Other muscle spasm: Secondary | ICD-10-CM | POA: Diagnosis not present

## 2019-08-22 DIAGNOSIS — N3946 Mixed incontinence: Secondary | ICD-10-CM | POA: Diagnosis not present

## 2019-08-22 DIAGNOSIS — M6281 Muscle weakness (generalized): Secondary | ICD-10-CM | POA: Diagnosis not present

## 2019-08-22 DIAGNOSIS — M6289 Other specified disorders of muscle: Secondary | ICD-10-CM | POA: Diagnosis not present

## 2019-08-31 DIAGNOSIS — M6281 Muscle weakness (generalized): Secondary | ICD-10-CM | POA: Diagnosis not present

## 2019-08-31 DIAGNOSIS — N3946 Mixed incontinence: Secondary | ICD-10-CM | POA: Diagnosis not present

## 2019-08-31 DIAGNOSIS — M6289 Other specified disorders of muscle: Secondary | ICD-10-CM | POA: Diagnosis not present

## 2019-08-31 DIAGNOSIS — M62838 Other muscle spasm: Secondary | ICD-10-CM | POA: Diagnosis not present

## 2019-09-14 DIAGNOSIS — N3946 Mixed incontinence: Secondary | ICD-10-CM | POA: Diagnosis not present

## 2019-09-14 DIAGNOSIS — M6281 Muscle weakness (generalized): Secondary | ICD-10-CM | POA: Diagnosis not present

## 2019-09-14 DIAGNOSIS — M6289 Other specified disorders of muscle: Secondary | ICD-10-CM | POA: Diagnosis not present

## 2019-09-14 DIAGNOSIS — M62838 Other muscle spasm: Secondary | ICD-10-CM | POA: Diagnosis not present

## 2019-09-20 DIAGNOSIS — R102 Pelvic and perineal pain: Secondary | ICD-10-CM | POA: Diagnosis not present

## 2019-09-20 DIAGNOSIS — M6281 Muscle weakness (generalized): Secondary | ICD-10-CM | POA: Diagnosis not present

## 2019-09-20 DIAGNOSIS — N941 Unspecified dyspareunia: Secondary | ICD-10-CM | POA: Diagnosis not present

## 2019-09-20 DIAGNOSIS — M62838 Other muscle spasm: Secondary | ICD-10-CM | POA: Diagnosis not present

## 2019-10-14 DIAGNOSIS — Z20828 Contact with and (suspected) exposure to other viral communicable diseases: Secondary | ICD-10-CM | POA: Diagnosis not present

## 2019-12-12 DIAGNOSIS — N760 Acute vaginitis: Secondary | ICD-10-CM | POA: Diagnosis not present

## 2019-12-20 DIAGNOSIS — Z20828 Contact with and (suspected) exposure to other viral communicable diseases: Secondary | ICD-10-CM | POA: Diagnosis not present

## 2020-03-09 DIAGNOSIS — S32020D Wedge compression fracture of second lumbar vertebra, subsequent encounter for fracture with routine healing: Secondary | ICD-10-CM | POA: Diagnosis not present

## 2020-03-09 DIAGNOSIS — Z6825 Body mass index (BMI) 25.0-25.9, adult: Secondary | ICD-10-CM | POA: Diagnosis not present

## 2020-03-09 DIAGNOSIS — M419 Scoliosis, unspecified: Secondary | ICD-10-CM | POA: Diagnosis not present

## 2020-03-09 DIAGNOSIS — M5116 Intervertebral disc disorders with radiculopathy, lumbar region: Secondary | ICD-10-CM | POA: Diagnosis not present

## 2020-03-19 ENCOUNTER — Other Ambulatory Visit: Payer: Self-pay | Admitting: Orthopedic Surgery

## 2020-03-19 DIAGNOSIS — M5116 Intervertebral disc disorders with radiculopathy, lumbar region: Secondary | ICD-10-CM

## 2020-03-20 ENCOUNTER — Other Ambulatory Visit: Payer: Self-pay

## 2020-03-20 ENCOUNTER — Ambulatory Visit
Admission: RE | Admit: 2020-03-20 | Discharge: 2020-03-20 | Disposition: A | Discharge status: Home / Self Care | Payer: 59 | Source: Ambulatory Visit | Attending: Orthopedic Surgery | Admitting: Orthopedic Surgery

## 2020-03-20 DIAGNOSIS — M5116 Intervertebral disc disorders with radiculopathy, lumbar region: Secondary | ICD-10-CM

## 2020-03-30 DIAGNOSIS — M4120 Other idiopathic scoliosis, site unspecified: Secondary | ICD-10-CM | POA: Diagnosis not present

## 2020-03-30 DIAGNOSIS — M7062 Trochanteric bursitis, left hip: Secondary | ICD-10-CM | POA: Diagnosis not present

## 2020-03-30 DIAGNOSIS — M41115 Juvenile idiopathic scoliosis, thoracolumbar region: Secondary | ICD-10-CM | POA: Diagnosis not present

## 2020-03-30 DIAGNOSIS — M5116 Intervertebral disc disorders with radiculopathy, lumbar region: Secondary | ICD-10-CM | POA: Diagnosis not present

## 2020-04-10 DIAGNOSIS — F411 Generalized anxiety disorder: Secondary | ICD-10-CM | POA: Diagnosis not present

## 2020-04-13 DIAGNOSIS — R300 Dysuria: Secondary | ICD-10-CM | POA: Diagnosis not present

## 2020-04-13 DIAGNOSIS — N390 Urinary tract infection, site not specified: Secondary | ICD-10-CM | POA: Diagnosis not present

## 2020-04-18 DIAGNOSIS — Z01419 Encounter for gynecological examination (general) (routine) without abnormal findings: Secondary | ICD-10-CM | POA: Diagnosis not present

## 2020-04-18 DIAGNOSIS — B373 Candidiasis of vulva and vagina: Secondary | ICD-10-CM | POA: Diagnosis not present

## 2020-04-18 DIAGNOSIS — Z6828 Body mass index (BMI) 28.0-28.9, adult: Secondary | ICD-10-CM | POA: Diagnosis not present

## 2020-04-18 DIAGNOSIS — F411 Generalized anxiety disorder: Secondary | ICD-10-CM | POA: Diagnosis not present

## 2020-04-26 DIAGNOSIS — M4120 Other idiopathic scoliosis, site unspecified: Secondary | ICD-10-CM | POA: Diagnosis not present

## 2020-04-26 DIAGNOSIS — M5416 Radiculopathy, lumbar region: Secondary | ICD-10-CM | POA: Diagnosis not present

## 2020-04-26 DIAGNOSIS — M5136 Other intervertebral disc degeneration, lumbar region: Secondary | ICD-10-CM | POA: Diagnosis not present

## 2020-04-27 DIAGNOSIS — F411 Generalized anxiety disorder: Secondary | ICD-10-CM | POA: Diagnosis not present

## 2020-05-07 DIAGNOSIS — M5136 Other intervertebral disc degeneration, lumbar region: Secondary | ICD-10-CM | POA: Diagnosis not present

## 2020-05-08 DIAGNOSIS — F411 Generalized anxiety disorder: Secondary | ICD-10-CM | POA: Diagnosis not present

## 2020-05-29 DIAGNOSIS — Z6825 Body mass index (BMI) 25.0-25.9, adult: Secondary | ICD-10-CM | POA: Diagnosis not present

## 2020-05-29 DIAGNOSIS — M5136 Other intervertebral disc degeneration, lumbar region: Secondary | ICD-10-CM | POA: Diagnosis not present

## 2020-05-29 DIAGNOSIS — M5416 Radiculopathy, lumbar region: Secondary | ICD-10-CM | POA: Diagnosis not present

## 2020-05-29 DIAGNOSIS — M4120 Other idiopathic scoliosis, site unspecified: Secondary | ICD-10-CM | POA: Diagnosis not present

## 2020-05-30 DIAGNOSIS — F411 Generalized anxiety disorder: Secondary | ICD-10-CM | POA: Diagnosis not present

## 2020-06-11 DIAGNOSIS — M5136 Other intervertebral disc degeneration, lumbar region: Secondary | ICD-10-CM | POA: Diagnosis not present

## 2020-06-27 DIAGNOSIS — R102 Pelvic and perineal pain: Secondary | ICD-10-CM | POA: Diagnosis not present

## 2020-06-27 DIAGNOSIS — R1084 Generalized abdominal pain: Secondary | ICD-10-CM | POA: Diagnosis not present

## 2020-06-27 DIAGNOSIS — Z118 Encounter for screening for other infectious and parasitic diseases: Secondary | ICD-10-CM | POA: Diagnosis not present

## 2020-07-02 DIAGNOSIS — M5416 Radiculopathy, lumbar region: Secondary | ICD-10-CM | POA: Diagnosis not present

## 2020-07-02 DIAGNOSIS — Z6825 Body mass index (BMI) 25.0-25.9, adult: Secondary | ICD-10-CM | POA: Diagnosis not present

## 2020-07-02 DIAGNOSIS — M4120 Other idiopathic scoliosis, site unspecified: Secondary | ICD-10-CM | POA: Diagnosis not present

## 2020-07-02 DIAGNOSIS — M5136 Other intervertebral disc degeneration, lumbar region: Secondary | ICD-10-CM | POA: Diagnosis not present

## 2020-07-10 DIAGNOSIS — R102 Pelvic and perineal pain: Secondary | ICD-10-CM | POA: Diagnosis not present

## 2020-08-01 DIAGNOSIS — M545 Low back pain: Secondary | ICD-10-CM | POA: Diagnosis not present

## 2020-08-01 DIAGNOSIS — M62838 Other muscle spasm: Secondary | ICD-10-CM | POA: Diagnosis not present

## 2020-08-01 DIAGNOSIS — R102 Pelvic and perineal pain: Secondary | ICD-10-CM | POA: Diagnosis not present

## 2020-08-01 DIAGNOSIS — M6281 Muscle weakness (generalized): Secondary | ICD-10-CM | POA: Diagnosis not present

## 2020-08-16 DIAGNOSIS — R102 Pelvic and perineal pain: Secondary | ICD-10-CM | POA: Diagnosis not present

## 2020-08-16 DIAGNOSIS — M62838 Other muscle spasm: Secondary | ICD-10-CM | POA: Diagnosis not present

## 2020-08-16 DIAGNOSIS — M545 Low back pain: Secondary | ICD-10-CM | POA: Diagnosis not present

## 2020-08-16 DIAGNOSIS — M6281 Muscle weakness (generalized): Secondary | ICD-10-CM | POA: Diagnosis not present

## 2020-08-23 DIAGNOSIS — R102 Pelvic and perineal pain: Secondary | ICD-10-CM | POA: Diagnosis not present

## 2020-08-23 DIAGNOSIS — M62838 Other muscle spasm: Secondary | ICD-10-CM | POA: Diagnosis not present

## 2020-08-23 DIAGNOSIS — Z20822 Contact with and (suspected) exposure to covid-19: Secondary | ICD-10-CM | POA: Diagnosis not present

## 2020-08-23 DIAGNOSIS — M6281 Muscle weakness (generalized): Secondary | ICD-10-CM | POA: Diagnosis not present

## 2020-08-23 DIAGNOSIS — M545 Low back pain: Secondary | ICD-10-CM | POA: Diagnosis not present

## 2020-09-05 DIAGNOSIS — M62838 Other muscle spasm: Secondary | ICD-10-CM | POA: Diagnosis not present

## 2020-09-05 DIAGNOSIS — N393 Stress incontinence (female) (male): Secondary | ICD-10-CM | POA: Diagnosis not present

## 2020-09-05 DIAGNOSIS — R102 Pelvic and perineal pain: Secondary | ICD-10-CM | POA: Diagnosis not present

## 2020-09-05 DIAGNOSIS — M6281 Muscle weakness (generalized): Secondary | ICD-10-CM | POA: Diagnosis not present

## 2020-09-12 DIAGNOSIS — R102 Pelvic and perineal pain: Secondary | ICD-10-CM | POA: Diagnosis not present

## 2020-09-12 DIAGNOSIS — M6281 Muscle weakness (generalized): Secondary | ICD-10-CM | POA: Diagnosis not present

## 2020-09-12 DIAGNOSIS — M62838 Other muscle spasm: Secondary | ICD-10-CM | POA: Diagnosis not present

## 2020-09-12 DIAGNOSIS — N393 Stress incontinence (female) (male): Secondary | ICD-10-CM | POA: Diagnosis not present

## 2020-09-19 DIAGNOSIS — N393 Stress incontinence (female) (male): Secondary | ICD-10-CM | POA: Diagnosis not present

## 2020-09-19 DIAGNOSIS — M62838 Other muscle spasm: Secondary | ICD-10-CM | POA: Diagnosis not present

## 2020-09-19 DIAGNOSIS — M6281 Muscle weakness (generalized): Secondary | ICD-10-CM | POA: Diagnosis not present

## 2020-09-19 DIAGNOSIS — R102 Pelvic and perineal pain: Secondary | ICD-10-CM | POA: Diagnosis not present

## 2020-10-03 DIAGNOSIS — M62838 Other muscle spasm: Secondary | ICD-10-CM | POA: Diagnosis not present

## 2020-10-03 DIAGNOSIS — M6281 Muscle weakness (generalized): Secondary | ICD-10-CM | POA: Diagnosis not present

## 2020-10-03 DIAGNOSIS — R102 Pelvic and perineal pain: Secondary | ICD-10-CM | POA: Diagnosis not present

## 2020-10-03 DIAGNOSIS — N393 Stress incontinence (female) (male): Secondary | ICD-10-CM | POA: Diagnosis not present

## 2020-10-08 ENCOUNTER — Other Ambulatory Visit: Payer: Self-pay | Admitting: Physician Assistant

## 2020-10-08 DIAGNOSIS — R10813 Right lower quadrant abdominal tenderness: Secondary | ICD-10-CM | POA: Diagnosis not present

## 2020-10-08 DIAGNOSIS — K219 Gastro-esophageal reflux disease without esophagitis: Secondary | ICD-10-CM | POA: Diagnosis not present

## 2020-10-08 DIAGNOSIS — R1031 Right lower quadrant pain: Secondary | ICD-10-CM | POA: Diagnosis not present

## 2020-10-08 DIAGNOSIS — K5900 Constipation, unspecified: Secondary | ICD-10-CM | POA: Diagnosis not present

## 2020-10-10 DIAGNOSIS — R1031 Right lower quadrant pain: Secondary | ICD-10-CM | POA: Diagnosis not present

## 2020-10-16 ENCOUNTER — Other Ambulatory Visit: Payer: BLUE CROSS/BLUE SHIELD

## 2020-10-30 ENCOUNTER — Other Ambulatory Visit: Payer: Self-pay | Admitting: Physician Assistant

## 2020-10-30 ENCOUNTER — Ambulatory Visit
Admission: RE | Admit: 2020-10-30 | Discharge: 2020-10-30 | Disposition: A | Discharge status: Home / Self Care | Payer: Self-pay | Source: Ambulatory Visit | Attending: Physician Assistant | Admitting: Physician Assistant

## 2020-10-30 ENCOUNTER — Other Ambulatory Visit: Payer: Self-pay

## 2020-10-30 DIAGNOSIS — R1031 Right lower quadrant pain: Secondary | ICD-10-CM

## 2020-11-09 ENCOUNTER — Other Ambulatory Visit: Payer: Self-pay

## 2020-11-09 ENCOUNTER — Ambulatory Visit
Admission: RE | Admit: 2020-11-09 | Discharge: 2020-11-09 | Disposition: A | Discharge status: Home / Self Care | Payer: Managed Care, Other (non HMO) | Source: Ambulatory Visit | Attending: Physician Assistant | Admitting: Physician Assistant

## 2020-11-09 DIAGNOSIS — R1031 Right lower quadrant pain: Secondary | ICD-10-CM

## 2020-11-09 MED ORDER — IOPAMIDOL (ISOVUE-300) INJECTION 61%
100.0000 mL | Freq: Once | INTRAVENOUS | Status: AC | PRN
  Administered 2020-11-09: 100 mL via INTRAVENOUS

## 2021-08-28 ENCOUNTER — Other Ambulatory Visit: Payer: Self-pay | Admitting: Physician Assistant

## 2021-08-28 DIAGNOSIS — M5414 Radiculopathy, thoracic region: Secondary | ICD-10-CM

## 2021-08-28 DIAGNOSIS — Z981 Arthrodesis status: Secondary | ICD-10-CM

## 2021-09-04 ENCOUNTER — Other Ambulatory Visit: Payer: Managed Care, Other (non HMO)

## 2021-10-08 ENCOUNTER — Ambulatory Visit
Admission: RE | Admit: 2021-10-08 | Discharge: 2021-10-08 | Disposition: A | Discharge status: Home / Self Care | Payer: Managed Care, Other (non HMO) | Source: Ambulatory Visit | Attending: Physician Assistant | Admitting: Physician Assistant

## 2021-10-08 ENCOUNTER — Other Ambulatory Visit: Payer: Self-pay

## 2021-10-08 DIAGNOSIS — M5414 Radiculopathy, thoracic region: Secondary | ICD-10-CM

## 2021-10-08 DIAGNOSIS — Z981 Arthrodesis status: Secondary | ICD-10-CM

## 2021-10-12 ENCOUNTER — Other Ambulatory Visit: Payer: Self-pay | Admitting: Orthopaedic Surgery

## 2021-10-12 DIAGNOSIS — M546 Pain in thoracic spine: Secondary | ICD-10-CM

## 2021-10-22 ENCOUNTER — Ambulatory Visit
Admission: RE | Admit: 2021-10-22 | Discharge: 2021-10-22 | Disposition: A | Discharge status: Home / Self Care | Payer: Managed Care, Other (non HMO) | Source: Ambulatory Visit | Attending: Orthopaedic Surgery | Admitting: Orthopaedic Surgery

## 2021-10-22 DIAGNOSIS — M546 Pain in thoracic spine: Secondary | ICD-10-CM

## 2021-10-22 MED ORDER — ONDANSETRON HCL 4 MG/2ML IJ SOLN
4.0000 mg | Freq: Once | INTRAMUSCULAR | Status: AC | PRN
  Administered 2021-10-22: 4 mg via INTRAMUSCULAR

## 2021-10-22 MED ORDER — DIAZEPAM 5 MG PO TABS
10.0000 mg | ORAL_TABLET | Freq: Once | ORAL | Status: AC
  Administered 2021-10-22: 10 mg via ORAL

## 2021-10-22 MED ORDER — MEPERIDINE HCL 50 MG/ML IJ SOLN
50.0000 mg | Freq: Once | INTRAMUSCULAR | Status: AC | PRN
  Administered 2021-10-22: 50 mg via INTRAMUSCULAR

## 2021-10-22 MED ORDER — IOPAMIDOL (ISOVUE-M 300) INJECTION 61%
10.0000 mL | Freq: Once | INTRAMUSCULAR | Status: AC | PRN
  Administered 2021-10-22: 10 mL via INTRATHECAL

## 2021-10-22 NOTE — Discharge Instr - Other Orders (Addendum)
3734: Pt reports pain 7/10 post myelogram procedure, see MAR  1012: pt reports relief in pain. Pt in nursing recovery area lying flat.

## 2021-10-22 NOTE — Discharge Instructions (Signed)

## 2021-11-05 IMAGING — MR MR LUMBAR SPINE W/O CM
4 of 6 series · 18 of 48 positions shown · non-contrast
Comparison: Plain films July 06, 2018 and MRI of the lumbar spine
September 30, 2018

CLINICAL DATA: Lower back pain. Displacement of lumbar disc with
radiculopathy.

EXAM:
MRI LUMBAR SPINE WITHOUT CONTRAST
TECHNIQUE: Multiplanar, multisequence MR imaging of the lumbar spine was
performed. No intravenous contrast was administered.

[Series 5: T2 · sagittal · 4.0mm · 0.88mm/px · 6 of 15 slices shown (1 of 2)]
[im 1/15]
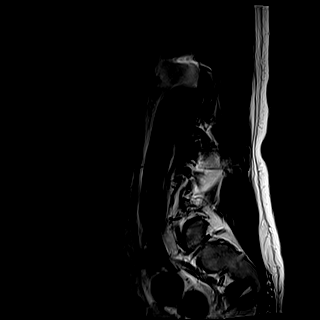
[im 3/15]
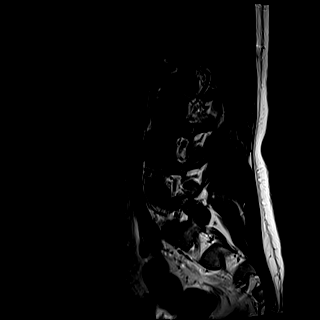
[im 6/15]
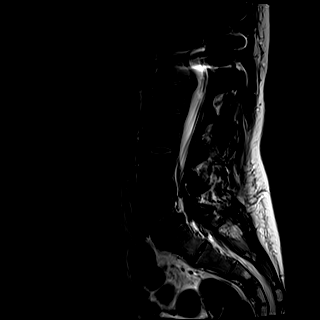
[im 9/15]
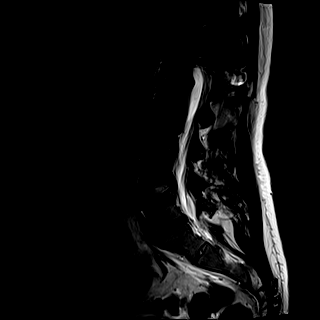
[im 12/15]
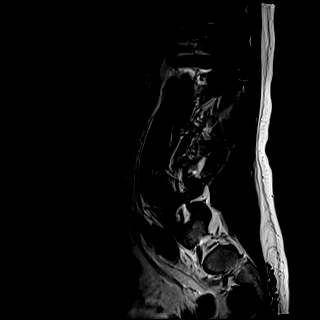
[im 15/15]
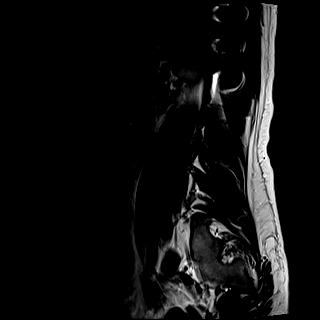

[Series 6: T1 · sagittal · 4.0mm · 0.73mm/px · 3 of 15 slices shown (1 of 2)]
[im 3/15]
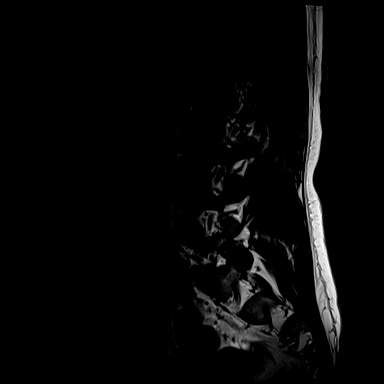
[im 9/15]
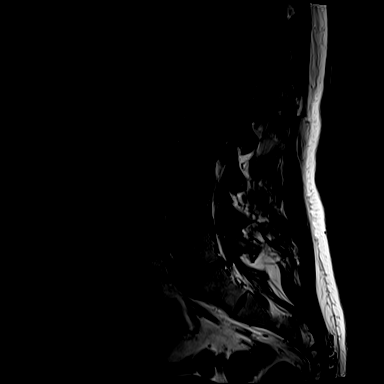
[im 15/15]
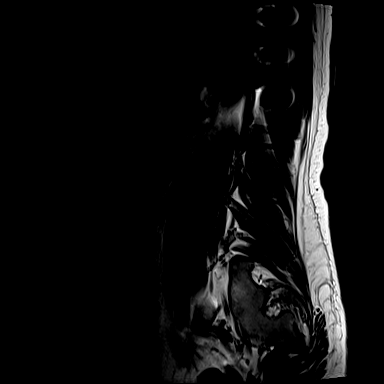

[Series 8: T1 · axial · 4.0mm · 0.28mm/px · z∈[+73,+128]mm · 3 of 17 slices shown (2 of 2)]
[im 3/17]
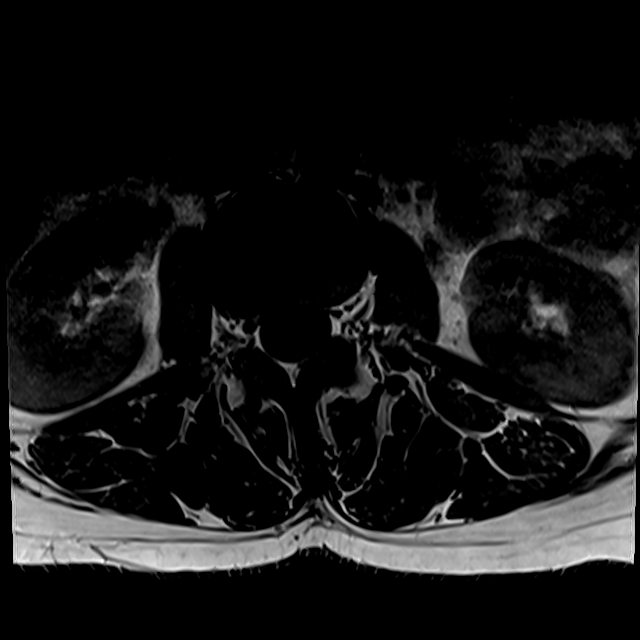
[im 9/17]
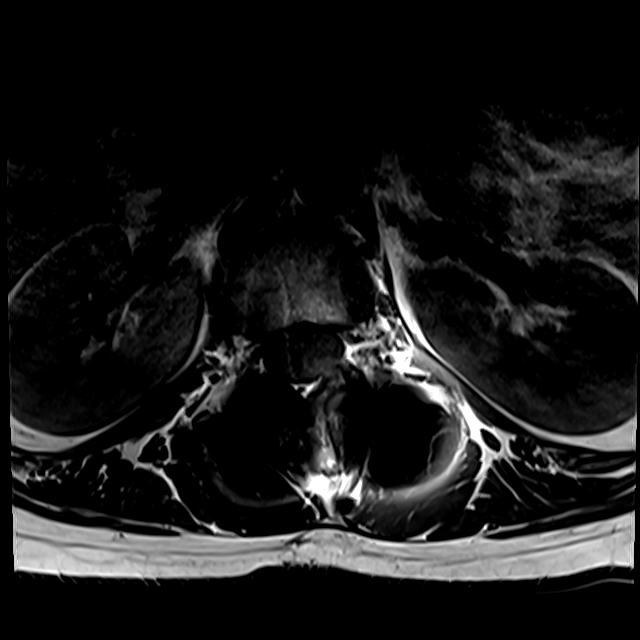
[im 14/17]
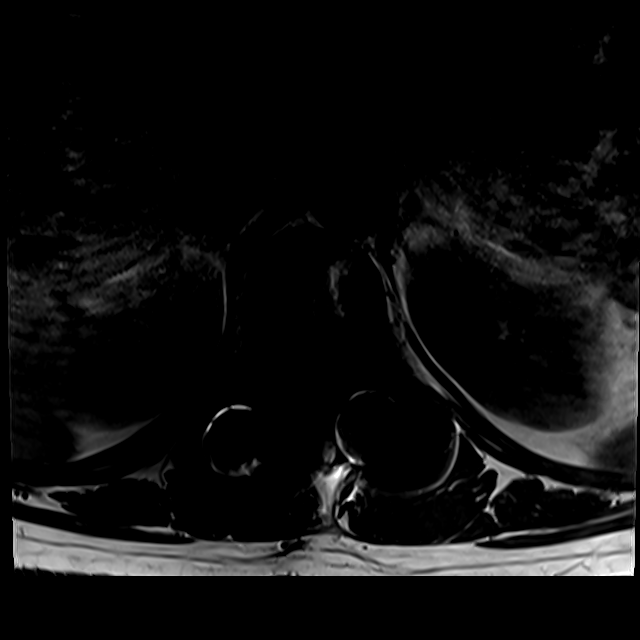

[Series 12: T2 · axial · 4.0mm · 0.28mm/px · z∈[-51,+113]mm · 6 of 38 slices shown (2 of 2)]
[im 1/38]
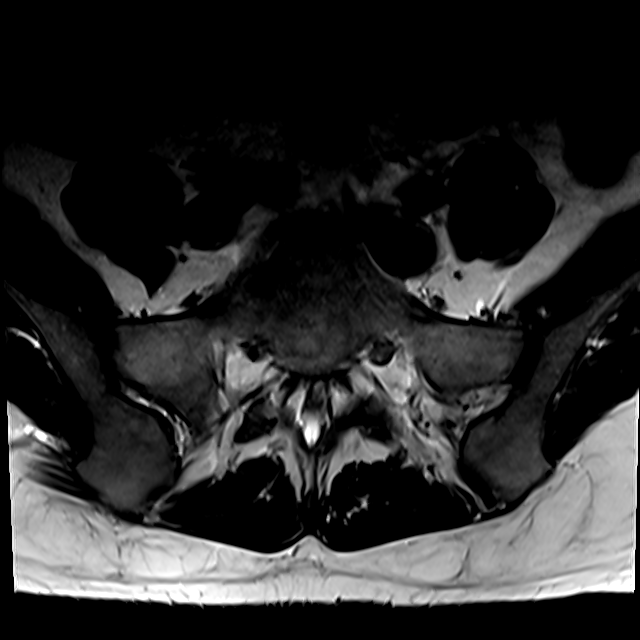
[im 6/38]
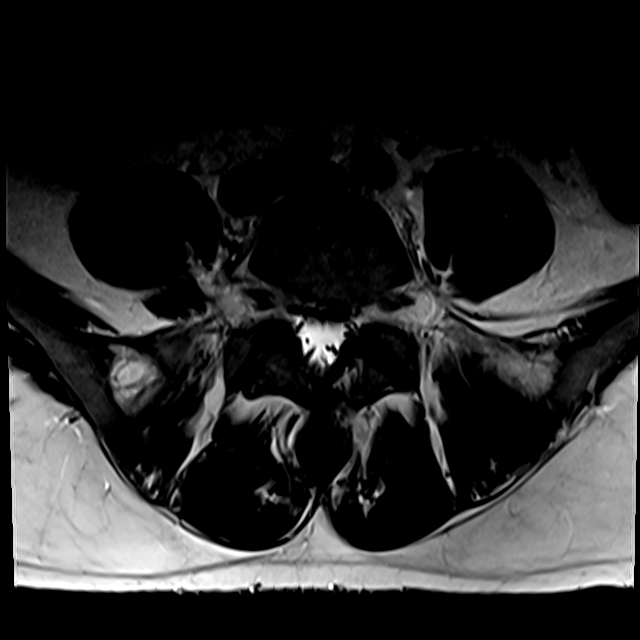
[im 11/38]
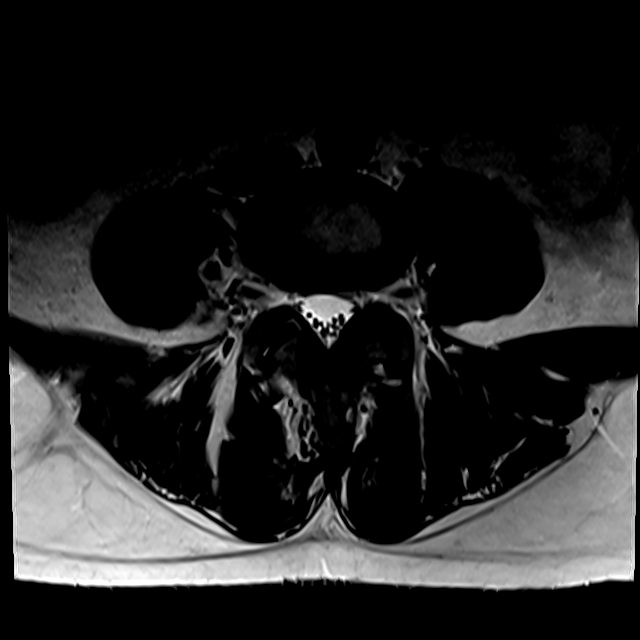
[im 16/38]
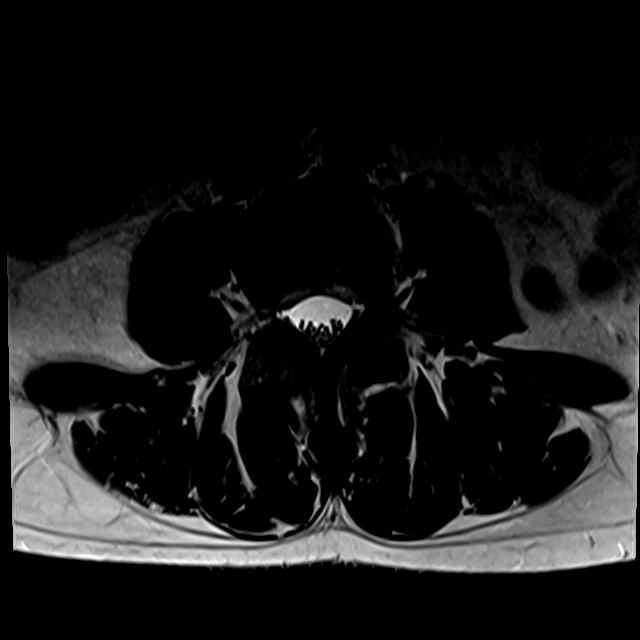
[im 19/38]
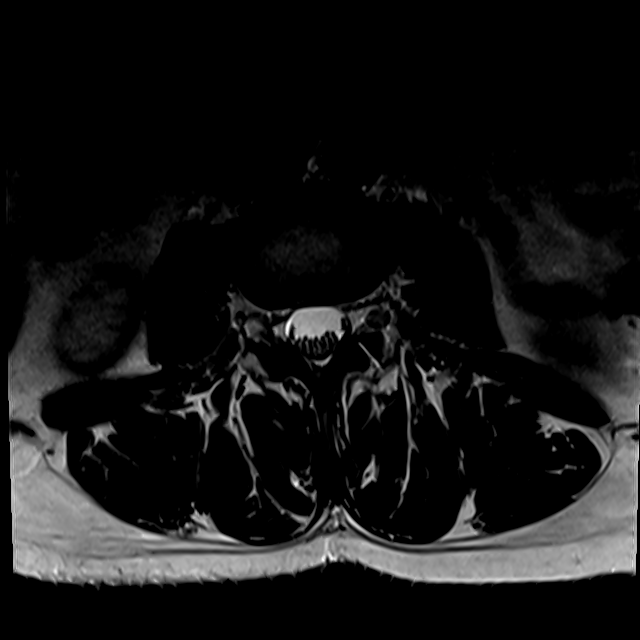
[im 32/38]
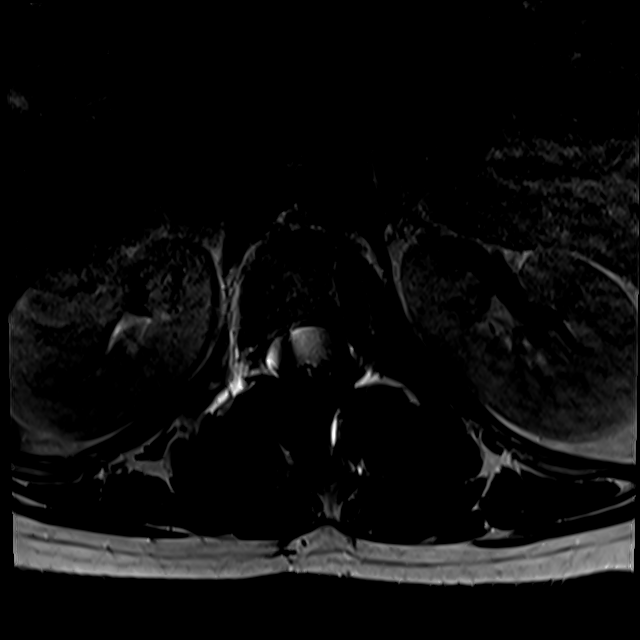

[18 of 48 positions shown; findings below may reference images not displayed]

FINDINGS: Segmentation: A transitional lumbosacral vertebra is assumed to
represent the L5 level. Description is in agreement with prior plain
films but discordant to prior MRI. Careful correlation with this
numbering strategy prior to any procedural intervention would be
recommended.

Alignment:  Physiologic.

Vertebrae: Again demonstrated is a fracture of the superior endplate
of L1, now without evidence of marrow edema, consistent with chronic
fracture. There is loss of approximately 20% of the vertebral body
height, noting minimal retropulsion of the superior aspect of the
posterior wall into the spinal canal, causing small indentation on
the thecal sac. No acute fracture, evidence of discitis or
aggressive bone lesion identified.

Posterior transpedicular fixation is noted in the visualized lower
thoracic spine.

Conus medullaris and cauda equina: Susceptibility artifact from
metal hardware limits evaluation of the contents. The roots of the
cauda equina appear normal.

Paraspinal and other soft tissues: Negative.

Disc levels:

T12-L1: Interval improvement of the minimal retropulsion of the
superior aspect of the posterior wall of L1 into the spinal canal,
causing small indentation of the thecal sac. No spinal canal or
neural foraminal stenosis.

L1-2: No spinal canal or neural foraminal stenosis.

L2-3: No spinal canal or neural foraminal stenosis.

L3-4: No spinal canal or neural foraminal stenosis.

L4-5: Mild disc desiccation, minimal posterior disc protrusion,
unchanged from prior. No spinal canal or neural foraminal stenosis.

L5-S1: Rudimentary disc. No spinal canal or neural foraminal
stenosis.
IMPRESSION: 1. A transitional lumbosacral vertebra is assumed to represent the
L5 level. Description is in agreement with prior plain films but
discordant to prior MRI.
2. Chronic L1 superior endplate compression fracture with loss of
approximately 20% of vertebral body height, not significantly
changed. No new fracture identified.
3. No spinal canal or neural foraminal stenosis.

## 2022-04-10 ENCOUNTER — Other Ambulatory Visit: Payer: Self-pay | Admitting: Rehabilitation

## 2022-04-10 DIAGNOSIS — M5412 Radiculopathy, cervical region: Secondary | ICD-10-CM

## 2022-04-10 DIAGNOSIS — M542 Cervicalgia: Secondary | ICD-10-CM

## 2022-04-23 ENCOUNTER — Ambulatory Visit
Admission: RE | Admit: 2022-04-23 | Discharge: 2022-04-23 | Disposition: A | Discharge status: Home / Self Care | Payer: Managed Care, Other (non HMO) | Source: Ambulatory Visit | Attending: Rehabilitation | Admitting: Rehabilitation

## 2022-04-23 DIAGNOSIS — M5412 Radiculopathy, cervical region: Secondary | ICD-10-CM

## 2022-04-23 DIAGNOSIS — M542 Cervicalgia: Secondary | ICD-10-CM

## 2022-04-30 ENCOUNTER — Ambulatory Visit: Payer: Managed Care, Other (non HMO) | Admitting: Sports Medicine

## 2022-05-15 ENCOUNTER — Ambulatory Visit: Payer: Managed Care, Other (non HMO) | Admitting: Sports Medicine

## 2022-10-01 HISTORY — PX: OTHER SURGICAL HISTORY: SHX169

## 2023-05-05 NOTE — Therapy (Unsigned)
OUTPATIENT PHYSICAL THERAPY FEMALE PELVIC EVALUATION   Patient Name: Amy Hancock MRN: [id number] DOB:06/12/1991, 32 y.o., female Today's Date: 05/06/2023  END OF SESSION:  PT End of Session - 05/06/23 0939     Visit Number 1    Date for PT Re-Evaluation 07/29/23    Authorization Type [employer]    Authorization - Visit Number 1    Authorization - Number of Visits 5    PT Start Time 0930    PT Stop Time 1035    PT Time Calculation (min) 65 min    Activity Tolerance Patient tolerated treatment well    Behavior During Therapy [facility] for tasks assessed/performed             Past Medical History:  Diagnosis Date   Scoliosis    Seasonal allergies    Urinary tract infection 05/09/2017   Past Surgical History:  Procedure Laterality Date   hardware removal from spine  10/01/2022   partial removal   SPINAL FUSION  04/12/2014   WISDOM TOOTH EXTRACTION     Patient Active Problem List   Diagnosis Date Noted   Urinary tract infection 05/09/2017    PCP: Marlow Baars, MD  REFERRING PROVIDER: Marlow Baars, MD   REFERRING DIAG: N39.3 (ICD-10-CM) - Female stress incontinence  THERAPY DIAG:  Muscle weakness (generalized)  Other muscle spasm  Other lack of coordination  Pelvic pain affecting pregnancy in second trimester, antepartum  Rationale for Evaluation and Treatment: Rehabilitation  ONSET DATE: 10/2022  SUBJECTIVE:                                                                                                                                                                                           SUBJECTIVE STATEMENT: [redacted] weeks pregnant. Patient has had pelvic floor physical therapy 2021. She broke her back in 2019 and noticed urinary issues.Had success with the pelvic floor therapy. Post op from hardware removal and pregnancy has urinary leakage daily. Yoga daily.  Fluid intake: Yes: mostly water, 2 cups coffee per day    PAIN:  Are you having pain? Yes NPRS  scale: 5/10 Pain location: Internal and coccyx left side of pelvic floor nerve pain, including burning and stabbing sensation that is intermittent  Pain type: aching, burning, and tingling, sitting then stand up will feel the pelvic floor fall asleep Pain description: intermittent   Aggravating factors: walking, sitting 30 minutes, certain yoga movements Relieving factors: rest  PAIN:  Are you having pain? Yes: NPRS scale: 8/10 Pain location: round ligament pain Pain description: brief, quick, sharp shooting Aggravating factors: sneezing, rolling over, stand off the toilet Relieving factors: pause  during the movement    PRECAUTIONS: Other: pregnant, thoracici spinal fusion , no lifting more than 15 pounds especially over head  WEIGHT BEARING RESTRICTIONS: No  FALLS:  Has patient fallen in last 6 months? Yes. Number of falls end of February was on plane and fainted so not due to balance  LIVING ENVIRONMENT: Lives with: lives with their family  OCCUPATION: social Water quality scientist  PLOF: Independent  PATIENT GOALS: reduce incontinence, reduce pain, strengthen pelvic floor  PERTINENT HISTORY:  Fusion of thoracic spine from T1-L1 2015 due to scoliosis, hardware removal left side of T1-L1 11/23; burst fracture L2  BOWEL MOVEMENT: Pain with bowel movement: No Type of bowel movement:Type (Bristol Stool Scale) 4, Frequency every other day sometimes longer, Strain Yes, and Splinting no Fully empty rectum: No, sometimes Leakage: No Fiber supplement: Yes: miralax  URINATION: Pain with urination: No Fully empty bladder: No, due to urinary leakage after urination, sometimes has to strain to empty the bladder Stream: Strong Urgency: Yes: since back issues Frequency: every hour Leakage: Coughing, Sneezing, Laughing, Exercise, and after urination , leaks 4 times per day, small and large amount Pads: Yes: 2 pads , when she is out of the house  INTERCOURSE: pain level 6-8/10 Pain  with intercourse: Initial Penetration and During Penetration, deep in the pelvis and entrance Ability to have vaginal penetration:  Yes:   Climax: sometimes Marinoff Scale: 2/3  PREGNANCY: Currently pregnant Yes: first pregnancy  PROLAPSE: None   OBJECTIVE:   DIAGNOSTIC FINDINGS:  none  PATIENT SURVEYS:  UIQ-7: 29; POPIQ-7 38 PFIQ-7 67  COGNITION: Overall cognitive status: Within functional limits for tasks assessed     SENSATION: Light touch: Deficits inner left thigh and left calf Proprioception: Appears intact   POSTURE: decreased lumbar lordosis  PELVIC ALIGNMENT: left PSIS posteriorly rotated, sacrum rotated right   LUMBARAROM/PROM: Lumbar limited by 25%   LOWER EXTREMITY ROM:  Passive ROM Right eval Left eval  Hip flexion 105 105  Hip external rotation 55 50   (Blank rows = not tested)  LOWER EXTREMITY MMT:  MMT Right eval Left eval  Hip flexion 4/5 4/5  Hip extension 5/5 5/5  Hip abduction 4+/5 4+/5  Hip adduction 4/5 4/5 with coccyx pain   PALPATION:   General  pain in round ligament                External Perineal Exam: left coccygeus, left levator ani, left ischiocavernosus, numbness right side of anus ant.                              Internal Pelvic Floor decreased movement of coccyx; tenderness located in the left pelvic floor, right obturator internist, decreased sensation of the right levator ani, tightness in the anococcygeal ligament  Patient confirms identification and approves PT to assess internal pelvic floor and treatment Yes  PELVIC MMT: anal sphincter not tested due to being too tight   MMT eval  Vaginal 3/5 but difficulty with relaxing  Internal Anal Sphincter   External Anal Sphincter   Puborectalis   Diastasis Recti   (Blank rows = not tested)        TONE: increased  PROLAPSE: Not assessed  TODAY'S TREATMENT:  DATE: 05/06/23  EVAL See below   PATIENT EDUCATION:  05/06/23 Education details: Access Code: [email or web address] Person educated: Patient Education method: Explanation, Demonstration, Tactile cues, Verbal cues, and Handouts Education comprehension: verbalized understanding, returned demonstration, verbal cues required, tactile cues required, and needs further education  HOME EXERCISE PROGRAM: 05/06/23 Access Code: [email or web address] URL: [email or web address] Date: 05/06/2023 Prepared by: Eulis Foster  Program Notes sit on a tennis ball to massage the pelvic floor.   Exercises - Supine Diaphragmatic Breathing  - 1 x daily - 7 x weekly - 1 sets - 10 reps - Seated Diaphragmatic Breathing  - 1 x daily - 7 x weekly - 1 sets - 10 reps - Seated Hip Flexor Stretch  - 1 x daily - 7 x weekly - 1 sets - 1 reps - 30 sec hold - Quadruped Rocking Slow  - 1 x daily - 7 x weekly - 1 sets - 10 reps  ASSESSMENT:  CLINICAL IMPRESSION: Patient is a 32 y.o. female who was seen today for physical therapy evaluation and treatment for stress incontinence. Patient urinary leakage started after her left thoracic hardware was removed 11/23. She is presently [redacted] weeks pregnant. She has a history of scoliosis with surgery to fuse T1-L1. She had a burst fracture on L2 that healed without surgery. Patient reports pelvic pain at level 5/10 with sitting, walking and yoga movements. She has round ligament pain at level 8/10. She has decreased sensation on the left inner thigh, left lateral calf, along the right levator ani, and left side of the anus. Tenderness located on the left levator ani, bilateral obturator internist, along the bladder and urethra, coccygeus and anococcygeal ligament. Increased tone throughout the pelvic floor. She has decreased hip ROM and lumbar. She has weakness in bilateral hips. She leaks urine after urination, coughing, sneezing and laughing.  She has to strain to urinate and  have a bowel movement. She has difficulty with relaxation of her pelvic floor after contraction. Patient will benefit from skilled therapy to reduce her pain and urinary leakage as her pregnancy progresses.   OBJECTIVE IMPAIRMENTS: decreased activity tolerance, decreased coordination, decreased endurance, decreased ROM, decreased strength, increased fascial restrictions, increased muscle spasms, impaired sensation, and pain.   ACTIVITY LIMITATIONS: sitting, standing, sleeping, transfers, bed mobility, continence, toileting, and locomotion level  PARTICIPATION LIMITATIONS: meal prep, cleaning, laundry, driving, shopping, community activity, and occupation  PERSONAL FACTORS: Time since onset of injury/illness/exacerbation and 3+ comorbidities: Fusion of thoracic spine from T1-L1 2015 due to scoliosis, hardware removal left side of T1-L1 11/23; burst fracture L2, pregnant  are also affecting patient's functional outcome.   REHAB POTENTIAL: Excellent  CLINICAL DECISION MAKING: Evolving/moderate complexity  EVALUATION COMPLEXITY: Moderate   GOALS: Goals reviewed with patient? Yes  SHORT TERM GOALS: Target date: 06/02/23  Patient is independent with diaphragmatic breathing to relax the pelvic floor.  Baseline: Goal status: INITIAL  2.  Patient is independent with hip and back stretches to reduce trigger points in the pelvic floor.  Baseline:  Goal status: INITIAL  3.  She is able to sit for 30 minutes with pain level decreased </= 5/10 due to reduction of trigger points and increased bilateral hip flexion ROM.  Baseline:  Goal status: INITIAL  4.  Patient understands ways to reduce her round ligament pain for the progression of her pregnancy.  Baseline:  Goal status: INITIAL   LONG TERM GOALS: Target date: 07/29/23  Patient independent with advanced HEP for pelvic floor coordination with contraction and relaxation.  Baseline:  Goal status: INITIAL  2.  Patient is able to sit for 30  minutes with pain level </= 3/10 due to pain management and reduction of trigger points.  Baseline:  Goal status: INITIAL  3.  Patient is independent with SI strengthening to keep the pelvis in correct alignment for 3 weeks to reduce her coccyx pain.  Baseline:  Goal status: INITIAL  4.  Patient understands ways to manage her pain with using a pregnancy belt, positioning as her pregnancy progresses due to her back history.  Baseline:  Goal status: INITIAL  5.  Patient reports she is able to reduce her urinary leakage </= 50% due to the ability to fully relax and contract her pelvic floor.  Baseline:  Goal status: INITIAL  6.  Patient understands different ways to give birth to reduce the strain on her back and pelvic floor.  Baseline:  Goal status: INITIAL  PLAN:  PT FREQUENCY: 1-2x/week  PT DURATION: 12 weeks  PLANNED INTERVENTIONS: Therapeutic exercises, Therapeutic activity, Neuromuscular re-education, Patient/Family education, Joint mobilization, Dry Needling, Cryotherapy, Moist heat, Taping, Biofeedback, and Manual therapy  PLAN FOR NEXT SESSION: manual therapy to the pelvic floor, release of the round ligament, correct pelvis, hip stretches   Eulis Foster, PT 05/06/23 11:01 AM

## 2023-05-06 ENCOUNTER — Encounter: Payer: Self-pay | Admitting: Physical Therapy

## 2023-05-06 ENCOUNTER — Other Ambulatory Visit: Payer: Self-pay

## 2023-05-06 ENCOUNTER — Ambulatory Visit: Payer: Managed Care, Other (non HMO) | Attending: Obstetrics | Admitting: Physical Therapy

## 2023-05-06 DIAGNOSIS — R278 Other lack of coordination: Secondary | ICD-10-CM | POA: Diagnosis present

## 2023-05-06 DIAGNOSIS — M6281 Muscle weakness (generalized): Secondary | ICD-10-CM | POA: Insufficient documentation

## 2023-05-06 DIAGNOSIS — O26892 Other specified pregnancy related conditions, second trimester: Secondary | ICD-10-CM | POA: Diagnosis present

## 2023-05-06 DIAGNOSIS — M62838 Other muscle spasm: Secondary | ICD-10-CM | POA: Diagnosis present

## 2023-05-06 DIAGNOSIS — R102 Pelvic and perineal pain: Secondary | ICD-10-CM | POA: Diagnosis present

## 2023-05-11 ENCOUNTER — Encounter: Payer: Self-pay | Admitting: Physical Therapy

## 2023-05-13 ENCOUNTER — Encounter: Payer: Self-pay | Admitting: Physical Therapy

## 2023-05-13 ENCOUNTER — Ambulatory Visit: Payer: Managed Care, Other (non HMO) | Admitting: Physical Therapy

## 2023-05-13 DIAGNOSIS — M6281 Muscle weakness (generalized): Secondary | ICD-10-CM

## 2023-05-13 DIAGNOSIS — R278 Other lack of coordination: Secondary | ICD-10-CM

## 2023-05-13 DIAGNOSIS — R102 Pelvic and perineal pain: Secondary | ICD-10-CM

## 2023-05-13 DIAGNOSIS — M62838 Other muscle spasm: Secondary | ICD-10-CM

## 2023-05-13 DIAGNOSIS — O26892 Other specified pregnancy related conditions, second trimester: Secondary | ICD-10-CM

## 2023-05-13 NOTE — Therapy (Unsigned)
OUTPATIENT PHYSICAL THERAPY FEMALE PELVIC TREATMENT   Patient Name: Amy Hancock MRN: [id number] DOB:[birth year], 32 y.o., female Today's Date: 05/14/2023  END OF SESSION:  PT End of Session - 05/13/23 1617     Visit Number 2    Date for PT Re-Evaluation 07/29/23    Authorization Type [employer]    Authorization - Visit Number 2    Authorization - Number of Visits 5    PT Start Time 1615    PT Stop Time 1655    PT Time Calculation (min) 40 min    Activity Tolerance Patient tolerated treatment well    Behavior During Therapy [facility] for tasks assessed/performed             Past Medical History:  Diagnosis Date   Scoliosis    Seasonal allergies    Urinary tract infection 05/09/2017   Past Surgical History:  Procedure Laterality Date   hardware removal from spine  10/01/2022   partial removal   SPINAL FUSION  04/12/2014   WISDOM TOOTH EXTRACTION     Patient Active Problem List   Diagnosis Date Noted   Urinary tract infection 05/09/2017    PCP: Marlow Baars, MD  REFERRING PROVIDER: Marlow Baars, MD   REFERRING DIAG: N39.3 (ICD-10-CM) - Female stress incontinence  THERAPY DIAG:  Muscle weakness (generalized)  Other muscle spasm  Other lack of coordination  Pelvic pain affecting pregnancy in second trimester, antepartum  Rationale for Evaluation and Treatment: Rehabilitation  ONSET DATE: 10/2022  SUBJECTIVE:                                                                                                                                                                                           SUBJECTIVE STATEMENT: I was sore in the tailbone area after treatment. Afterwards it felt better.   PAIN:  Are you having pain? Yes NPRS scale: 6/10 Pain location: Internal and coccyx left side of pelvic floor nerve pain, including burning and stabbing sensation that is intermittent  Pain type: aching, burning, and tingling, sitting then stand up will feel the pelvic  floor fall asleep Pain description: intermittent   Aggravating factors: walking, sitting 30 minutes, certain yoga movements Relieving factors: rest  PAIN:  Are you having pain? Yes: NPRS scale: 8/10 Pain location: round ligament pain Pain description: brief, quick, sharp shooting Aggravating factors: sneezing, rolling over, stand off the toilet Relieving factors: pause during the movement    PRECAUTIONS: Other: pregnant, thoracici spinal fusion , no lifting more than 15 pounds especially over head  WEIGHT BEARING RESTRICTIONS: No  FALLS:  Has patient fallen in last 6 months? Yes. Number  of falls end of February was on plane and fainted so not due to balance  LIVING ENVIRONMENT: Lives with: lives with their family  OCCUPATION: social Water quality scientist  PLOF: Independent  PATIENT GOALS: reduce incontinence, reduce pain, strengthen pelvic floor  PERTINENT HISTORY:  Fusion of thoracic spine from T1-L1 2015 due to scoliosis, hardware removal left side of T1-L1 11/23; burst fracture L2  BOWEL MOVEMENT: Pain with bowel movement: No Type of bowel movement:Type (Bristol Stool Scale) 4, Frequency every other day sometimes longer, Strain Yes, and Splinting no Fully empty rectum: No, sometimes Leakage: No Fiber supplement: Yes: miralax  URINATION: Pain with urination: No Fully empty bladder: No, due to urinary leakage after urination, sometimes has to strain to empty the bladder Stream: Strong Urgency: Yes: since back issues Frequency: every hour Leakage: Coughing, Sneezing, Laughing, Exercise, and after urination , leaks 4 times per day, small and large amount Pads: Yes: 2 pads , when she is out of the house  INTERCOURSE: pain level 6-8/10 Pain with intercourse: Initial Penetration and During Penetration, deep in the pelvis and entrance Ability to have vaginal penetration:  Yes:   Climax: sometimes Marinoff Scale: 2/3  PREGNANCY: Currently pregnant Yes: first  pregnancy  PROLAPSE: None   OBJECTIVE:   DIAGNOSTIC FINDINGS:  none  PATIENT SURVEYS:  UIQ-7: 29; POPIQ-7 38 PFIQ-7 67  COGNITION: Overall cognitive status: Within functional limits for tasks assessed     SENSATION: Light touch: Deficits inner left thigh and left calf Proprioception: Appears intact   POSTURE: decreased lumbar lordosis  PELVIC ALIGNMENT: left PSIS posteriorly rotated, sacrum rotated right   LUMBARAROM/PROM: Lumbar limited by 25%   LOWER EXTREMITY ROM:  Passive ROM Right eval Left eval  Hip flexion 105 105  Hip external rotation 55 50   (Blank rows = not tested)  LOWER EXTREMITY MMT:  MMT Right eval Left eval  Hip flexion 4/5 4/5  Hip extension 5/5 5/5  Hip abduction 4+/5 4+/5  Hip adduction 4/5 4/5 with coccyx pain   PALPATION:   General  pain in round ligament                External Perineal Exam: left coccygeus, left levator ani, left ischiocavernosus, numbness right side of anus ant.                              Internal Pelvic Floor decreased movement of coccyx; tenderness located in the left pelvic floor, right obturator internist, decreased sensation of the right levator ani, tightness in the anococcygeal ligament  Patient confirms identification and approves PT to assess internal pelvic floor and treatment Yes  PELVIC MMT: anal sphincter not tested due to being too tight   MMT eval  Vaginal 3/5 but difficulty with relaxing  Internal Anal Sphincter   External Anal Sphincter   Puborectalis   Diastasis Recti   (Blank rows = not tested)        TONE: increased  PROLAPSE: Not assessed  TODAY'S TREATMENT:     05/13/23 Manual: Myofascial release: Round ligament release in sidely for both Quadruped release of the anococcygeal ligament moving forward and backward and diagonals Spinal mobilization: Mobilization of left SI joint in prone with pillows to support abdomen to correct left  rotation Exercises: Stretches/mobility: Cat cow 10 x Quadruped bringing opposite elbow to knee 10 x each side Z stretch going up and down Patient was educated on not sitting longer than 30 minutes  due to round ligament pain.  Sitting with ball between knees and move femur forward and back Quadruped with knee on yoga block and move to the side of the yoga block   PATIENT EDUCATION:  05/06/23 Education details: Access Code: [email or web address] Person educated: Patient Education method: Explanation, Demonstration, Tactile cues, Verbal cues, and Handouts Education comprehension: verbalized understanding, returned demonstration, verbal cues required, tactile cues required, and needs further education  HOME EXERCISE PROGRAM: 05/06/23 Access Code: [email or web address] URL: [email or web address] Date: 05/13/2023 Prepared by: Eulis Foster  Program Notes sit on a tennis ball to massage the pelvic floor. z stretch going up and down 5 times each side  Exercises - Supine Diaphragmatic Breathing  - 1 x daily - 7 x weekly - 1 sets - 10 reps - Seated Diaphragmatic Breathing  - 1 x daily - 7 x weekly - 1 sets - 10 reps - Seated Hip Flexor Stretch  - 1 x daily - 7 x weekly - 1 sets - 1 reps - 30 sec hold - Quadruped Rocking Slow  - 1 x daily - 7 x weekly - 1 sets - 10 reps - Cat Cow  - 1 x daily - 7 x weekly - 1 sets - 10 reps - Quadruped Pelvic Floor Contraction with Opposite Arm and Leg Lift  - 1 x daily - 7 x weekly - 1 sets - 10 reps - Quadruped Yoga Block Lift Off with Loop Band Fire Hydrant  - 1 x daily - 7 x weekly - 1 sets - 10 reps  ASSESSMENT:  CLINICAL IMPRESSION: Patient is a 32 y.o. female who was seen today for physical therapy  and treatment for stress incontinence. Patient urinary leakage started after her left thoracic hardware was removed 11/23. She is presently [redacted] weeks pregnant. She has a history of scoliosis with surgery to fuse T1-L1. She had a burst fracture on L2 that healed without  surgery.  Patient has increased movement of left SI joint helping with the position of the coccyx. She had less pain after the manual work. Patient has tightness in the lumbar area reducing the movement of the coccyx. Patient will benefit from skilled therapy to reduce her pain and urinary leakage as her pregnancy progresses.   OBJECTIVE IMPAIRMENTS: decreased activity tolerance, decreased coordination, decreased endurance, decreased ROM, decreased strength, increased fascial restrictions, increased muscle spasms, impaired sensation, and pain.   ACTIVITY LIMITATIONS: sitting, standing, sleeping, transfers, bed mobility, continence, toileting, and locomotion level  PARTICIPATION LIMITATIONS: meal prep, cleaning, laundry, driving, shopping, community activity, and occupation  PERSONAL FACTORS: Time since onset of injury/illness/exacerbation and 3+ comorbidities: Fusion of thoracic spine from T1-L1 2015 due to scoliosis, hardware removal left side of T1-L1 11/23; burst fracture L2, pregnant  are also affecting patient's functional outcome.   REHAB POTENTIAL: Excellent  CLINICAL DECISION MAKING: Evolving/moderate complexity  EVALUATION COMPLEXITY: Moderate   GOALS: Goals reviewed with patient? Yes  SHORT TERM GOALS: Target date: 06/02/23  Patient is independent with diaphragmatic breathing to relax the pelvic floor.  Baseline: Goal status: INITIAL  2.  Patient is independent with hip and back stretches to reduce trigger points in the pelvic floor.  Baseline:  Goal status: INITIAL  3.  She is able to sit for 30 minutes with pain level decreased </= 5/10 due to reduction of trigger points and increased bilateral hip flexion ROM.  Baseline:  Goal status: INITIAL  4.  Patient understands ways to reduce her round ligament pain for the progression of her pregnancy.  Baseline:  Goal status: INITIAL   LONG TERM GOALS: Target date: 07/29/23  Patient independent with advanced HEP for pelvic  floor coordination with contraction and relaxation.  Baseline:  Goal status: INITIAL  2.  Patient is able to sit for 30 minutes with pain level </= 3/10 due to pain management and reduction of trigger points.  Baseline:  Goal status: INITIAL  3.  Patient is independent with SI strengthening to keep the pelvis in correct alignment for 3 weeks to reduce her coccyx pain.  Baseline:  Goal status: INITIAL  4.  Patient understands ways to manage her pain with using a pregnancy belt, positioning as her pregnancy progresses due to her back history.  Baseline:  Goal status: INITIAL  5.  Patient reports she is able to reduce her urinary leakage </= 50% due to the ability to fully relax and contract her pelvic floor.  Baseline:  Goal status: INITIAL  6.  Patient understands different ways to give birth to reduce the strain on her back and pelvic floor.  Baseline:  Goal status: INITIAL  PLAN:  PT FREQUENCY: 1-2x/week  PT DURATION: 12 weeks  PLANNED INTERVENTIONS: Therapeutic exercises, Therapeutic activity, Neuromuscular re-education, Patient/Family education, Joint mobilization, Dry Needling, Cryotherapy, Moist heat, Taping, Biofeedback, and Manual therapy  PLAN FOR NEXT SESSION: manual therapy to the pelvic floor,  correct pelvis, hip stretches for posterior hip; manual work to lumbar especially on the left   Eulis Foster, PT 05/14/23 8:02 AM

## 2023-05-20 ENCOUNTER — Ambulatory Visit: Payer: Managed Care, Other (non HMO) | Admitting: Physical Therapy

## 2023-05-27 ENCOUNTER — Encounter: Payer: Self-pay | Admitting: Physical Therapy

## 2023-05-27 ENCOUNTER — Ambulatory Visit: Payer: Managed Care, Other (non HMO) | Attending: Obstetrics | Admitting: Physical Therapy

## 2023-05-27 DIAGNOSIS — R278 Other lack of coordination: Secondary | ICD-10-CM

## 2023-05-27 DIAGNOSIS — M62838 Other muscle spasm: Secondary | ICD-10-CM

## 2023-05-27 DIAGNOSIS — O26892 Other specified pregnancy related conditions, second trimester: Secondary | ICD-10-CM | POA: Diagnosis present

## 2023-05-27 DIAGNOSIS — M6281 Muscle weakness (generalized): Secondary | ICD-10-CM | POA: Diagnosis present

## 2023-05-27 DIAGNOSIS — R102 Pelvic and perineal pain: Secondary | ICD-10-CM | POA: Diagnosis present

## 2023-05-27 NOTE — Therapy (Addendum)
OUTPATIENT PHYSICAL THERAPY FEMALE PELVIC TREATMENT   Patient Name: Amy Hancock MRN: [id number] DOB:Jul 03, 1991, 32 y.o., female Today's Date: 05/27/2023  END OF SESSION:  PT End of Session - 05/27/23 1020     Visit Number 3    Date for PT Re-Evaluation 07/29/23    Authorization Type [employer]    Authorization - Visit Number 3    Authorization - Number of Visits 5    PT Start Time 1020    PT Stop Time 1100    PT Time Calculation (min) 40 min    Activity Tolerance Patient tolerated treatment well    Behavior During Therapy [facility] for tasks assessed/performed             Past Medical History:  Diagnosis Date   Scoliosis    Seasonal allergies    Urinary tract infection 05/09/2017   Past Surgical History:  Procedure Laterality Date   hardware removal from spine  10/01/2022   partial removal   SPINAL FUSION  04/12/2014   WISDOM TOOTH EXTRACTION     Patient Active Problem List   Diagnosis Date Noted   Urinary tract infection 05/09/2017    PCP: Marlow Baars, MD  REFERRING PROVIDER: Marlow Baars, MD   REFERRING DIAG: N39.3 (ICD-10-CM) - Female stress incontinence  THERAPY DIAG:  Muscle weakness (generalized)  Other muscle spasm  Other lack of coordination  Pelvic pain affecting pregnancy in second trimester, antepartum  Rationale for Evaluation and Treatment: Rehabilitation  ONSET DATE: 10/2022  SUBJECTIVE:                                                                                                                                                                                           SUBJECTIVE STATEMENT: Urine leakage is the same. I still have the tailbone pain. I have switched OBGYN.    PAIN:  Are you having pain? Yes NPRS scale: 6/10 Pain location: Internal and coccyx left side of pelvic floor nerve pain, including burning and stabbing sensation that is intermittent  Pain type: aching, burning, and tingling, sitting then stand up will feel the  pelvic floor fall asleep Pain description: intermittent   Aggravating factors: walking, sitting 30 minutes, certain yoga movements Relieving factors: rest  PAIN:  Are you having pain? Yes: NPRS scale: 8/10 Pain location: round ligament pain Pain description: brief, quick, sharp shooting Aggravating factors: sneezing, rolling over, stand off the toilet Relieving factors: pause during the movement    PRECAUTIONS: Other: pregnant, thoracici spinal fusion , no lifting more than 15 pounds especially over head  WEIGHT BEARING RESTRICTIONS: No  FALLS:  Has patient fallen in last 6  months? Yes. Number of falls end of February was on plane and fainted so not due to balance  LIVING ENVIRONMENT: Lives with: lives with their family  OCCUPATION: social Water quality scientist  PLOF: Independent  PATIENT GOALS: reduce incontinence, reduce pain, strengthen pelvic floor  PERTINENT HISTORY:  Fusion of thoracic spine from T1-L1 2015 due to scoliosis, hardware removal left side of T1-L1 11/23; burst fracture L2  BOWEL MOVEMENT: Pain with bowel movement: No Type of bowel movement:Type (Bristol Stool Scale) 4, Frequency every other day sometimes longer, Strain Yes, and Splinting no Fully empty rectum: No, sometimes Leakage: No Fiber supplement: Yes: miralax  URINATION: Pain with urination: No Fully empty bladder: No, due to urinary leakage after urination, sometimes has to strain to empty the bladder Stream: Strong Urgency: Yes: since back issues Frequency: every hour Leakage: Coughing, Sneezing, Laughing, Exercise, and after urination , leaks 4 times per day, small and large amount Pads: Yes: 2 pads , when she is out of the house  INTERCOURSE: pain level 6-8/10 Pain with intercourse: Initial Penetration and During Penetration, deep in the pelvis and entrance Ability to have vaginal penetration:  Yes:   Climax: sometimes Marinoff Scale: 2/3  PREGNANCY: Currently pregnant Yes: first  pregnancy  PROLAPSE: None   OBJECTIVE:   DIAGNOSTIC FINDINGS:  none  PATIENT SURVEYS:  UIQ-7: 29; POPIQ-7 38 PFIQ-7 67  COGNITION: Overall cognitive status: Within functional limits for tasks assessed     SENSATION: Light touch: Deficits inner left thigh and left calf Proprioception: Appears intact   POSTURE: decreased lumbar lordosis  PELVIC ALIGNMENT: left PSIS posteriorly rotated, sacrum rotated right   LUMBARAROM/PROM: Lumbar limited by 25%   LOWER EXTREMITY ROM:  Passive ROM Right eval Left eval  Hip flexion 105 105  Hip external rotation 55 50   (Blank rows = not tested)  LOWER EXTREMITY MMT:  MMT Right eval Left eval  Hip flexion 4/5 4/5  Hip extension 5/5 5/5  Hip abduction 4+/5 4+/5  Hip adduction 4/5 4/5 with coccyx pain   PALPATION:   General  pain in round ligament                External Perineal Exam: left coccygeus, left levator ani, left ischiocavernosus, numbness right side of anus ant.                              Internal Pelvic Floor decreased movement of coccyx; tenderness located in the left pelvic floor, right obturator internist, decreased sensation of the right levator ani, tightness in the anococcygeal ligament  Patient confirms identification and approves PT to assess internal pelvic floor and treatment Yes  PELVIC MMT: anal sphincter not tested due to being too tight   MMT eval 05/27/23  Vaginal 3/5 but difficulty with relaxing 3/5 with good hug of therapist finger and lift after 5 contractions holding for 5 sec  Internal Anal Sphincter    External Anal Sphincter    Puborectalis    Diastasis Recti    (Blank rows = not tested)        TONE: increased  PROLAPSE: Not assessed  TODAY'S TREATMENT:     05/27/23 Manual: Myofascial release: Release of urogenital diaphragm going through the different layers Internal pelvic floor techniques: No emotional/communication barriers or cognitive limitation. Patient is motivated  to learn. Patient understands and agrees with treatment goals and plan. PT explains patient will be examined in standing, sitting, and lying down  to see how their muscles and joints work. When they are ready, they will be asked to remove their underwear so PT can examine their perineum. The patient is also given the option of providing their own chaperone as one is not provided in our facility. The patient also has the right and is explained the right to defer or refuse any part of the evaluation or treatment including the internal exam. With the patient's consent, PT will use one gloved finger to gently assess the muscles of the pelvic floor, seeing how well it contracts and relaxes and if there is muscle symmetry. After, the patient will get dressed and PT and patient will discuss exam findings and plan of care. PT and patient discuss plan of care, schedule, attendance policy and HEP activities.  Going though the vagina working on the levator ani, along the introitus to lengthen the muscles and reduce pain Neuromuscular re-education: Pelvic floor contraction training: Therapist finger in the vaginal canal giving tactile cues for pelvic floor relaxation while performing diaphragmatic breathing Therapist finger in the vaginal canal working on pelvic floor contraction with full relaxation     05/13/23 Manual: Myofascial release: Round ligament release in sidely for both Quadruped release of the anococcygeal ligament moving forward and backward and diagonals Spinal mobilization: Mobilization of left SI joint in prone with pillows to support abdomen to correct left rotation Exercises: Stretches/mobility: Cat cow 10 x Quadruped bringing opposite elbow to knee 10 x each side Z stretch going up and down Patient was educated on not sitting longer than 30 minutes due to round ligament pain.  Sitting with ball between knees and move femur forward and back Quadruped with knee on yoga block and move to the  side of the yoga block   PATIENT EDUCATION:  05/06/23 Education details: Access Code: [email or web address] Person educated: Patient Education method: Explanation, Demonstration, Tactile cues, Verbal cues, and Handouts Education comprehension: verbalized understanding, returned demonstration, verbal cues required, tactile cues required, and needs further education  HOME EXERCISE PROGRAM: 05/06/23 Access Code: [email or web address] URL: [email or web address] Date: 05/13/2023 Prepared by: Eulis Foster  Program Notes sit on a tennis ball to massage the pelvic floor. z stretch going up and down 5 times each side  Exercises - Supine Diaphragmatic Breathing  - 1 x daily - 7 x weekly - 1 sets - 10 reps - Seated Diaphragmatic Breathing  - 1 x daily - 7 x weekly - 1 sets - 10 reps - Seated Hip Flexor Stretch  - 1 x daily - 7 x weekly - 1 sets - 1 reps - 30 sec hold - Quadruped Rocking Slow  - 1 x daily - 7 x weekly - 1 sets - 10 reps - Cat Cow  - 1 x daily - 7 x weekly - 1 sets - 10 reps - Quadruped Pelvic Floor Contraction with Opposite Arm and Leg Lift  - 1 x daily - 7 x weekly - 1 sets - 10 reps - Quadruped Yoga Block Lift Off with Loop Band Fire Hydrant  - 1 x daily - 7 x weekly - 1 sets - 10 reps  ASSESSMENT:  CLINICAL IMPRESSION: Patient is a 32 y.o. female who was seen today for physical therapy  and treatment for stress incontinence. Patient urinary leakage started after her left thoracic hardware was removed 11/23. She is presently [redacted] weeks pregnant. She has a history of scoliosis with surgery to fuse T1-L1. She had a burst fracture on L2 that healed without surgery.  Pelvic  floor strength is 3/5 with improved hug and lift of therapist finger. The manual work helped her coccyx pain but not her sacral pain. She was able to perform pelvic drop with diaphragmatic breathing. Patient needed verbal cues during the internal work to relax her gluteals.  Patient will benefit from skilled therapy to reduce  her pain and urinary leakage as her pregnancy progresses.   OBJECTIVE IMPAIRMENTS: decreased activity tolerance, decreased coordination, decreased endurance, decreased ROM, decreased strength, increased fascial restrictions, increased muscle spasms, impaired sensation, and pain.   ACTIVITY LIMITATIONS: sitting, standing, sleeping, transfers, bed mobility, continence, toileting, and locomotion level  PARTICIPATION LIMITATIONS: meal prep, cleaning, laundry, driving, shopping, community activity, and occupation  PERSONAL FACTORS: Time since onset of injury/illness/exacerbation and 3+ comorbidities: Fusion of thoracic spine from T1-L1 2015 due to scoliosis, hardware removal left side of T1-L1 11/23; burst fracture L2, pregnant  are also affecting patient's functional outcome.   REHAB POTENTIAL: Excellent  CLINICAL DECISION MAKING: Evolving/moderate complexity  EVALUATION COMPLEXITY: Moderate   GOALS: Goals reviewed with patient? Yes  SHORT TERM GOALS: Target date: 06/02/23  Patient is independent with diaphragmatic breathing to relax the pelvic floor.  Baseline: Goal status: Met 05/27/23  2.  Patient is independent with hip and back stretches to reduce trigger points in the pelvic floor.  Baseline:  Goal status: INITIAL  3.  She is able to sit for 30 minutes with pain level decreased </= 5/10 due to reduction of trigger points and increased bilateral hip flexion ROM.  Baseline:  Goal status: INITIAL  4.  Patient understands ways to reduce her round ligament pain for the progression of her pregnancy.  Baseline:  Goal status: Met 05/27/23   LONG TERM GOALS: Target date: 07/29/23  Patient independent with advanced HEP for pelvic floor coordination with contraction and relaxation.  Baseline:  Goal status: INITIAL  2.  Patient is able to sit for 30 minutes with pain level </= 3/10 due to pain management and reduction of trigger points.  Baseline:  Goal status: INITIAL  3.  Patient is  independent with SI strengthening to keep the pelvis in correct alignment for 3 weeks to reduce her coccyx pain.  Baseline:  Goal status: INITIAL  4.  Patient understands ways to manage her pain with using a pregnancy belt, positioning as her pregnancy progresses due to her back history.  Baseline:  Goal status: INITIAL  5.  Patient reports she is able to reduce her urinary leakage </= 50% due to the ability to fully relax and contract her pelvic floor.  Baseline:  Goal status: INITIAL  6.  Patient understands different ways to give birth to reduce the strain on her back and pelvic floor.  Baseline:  Goal status: INITIAL  PLAN:  PT FREQUENCY: 1-2x/week  PT DURATION: 12 weeks  PLANNED INTERVENTIONS: Therapeutic exercises, Therapeutic activity, Neuromuscular re-education, Patient/Family education, Joint mobilization, Dry Needling, Cryotherapy, Moist heat, Taping, Biofeedback, and Manual therapy  PLAN FOR NEXT SESSION: manual therapy to the pelvic floor,  correct pelvis, work on decreasing gluteal clenching  Eulis Foster, PT 05/27/23 10:20 AM  PHYSICAL THERAPY DISCHARGE SUMMARY  Visits from Start of Care: 3  Current functional level related to goals / functional outcomes: See above. Patient has not returned to therapy since 05/27/23.    Remaining deficits: See above.    Education / Equipment: HEP   Patient agrees to discharge. Patient goals were not met. Patient is being discharged due to not returning since the last visit. Thank  you for the referral.   Eulis Foster, PT 07/28/23 8:28 AM

## 2023-06-05 ENCOUNTER — Ambulatory Visit: Payer: Managed Care, Other (non HMO) | Admitting: Physical Therapy

## 2023-06-08 ENCOUNTER — Ambulatory Visit: Payer: Managed Care, Other (non HMO) | Admitting: Physical Therapy

## 2023-06-08 ENCOUNTER — Telehealth: Payer: Self-pay | Admitting: Physical Therapy

## 2023-06-08 NOTE — Telephone Encounter (Signed)
Called patient about her missed appointment today at 8:30.  Left a message.  Eulis Foster, PT @7 /15/24@ 9:11 AM

## 2023-06-18 ENCOUNTER — Other Ambulatory Visit: Payer: Self-pay | Admitting: Obstetrics & Gynecology

## 2023-06-18 ENCOUNTER — Telehealth: Payer: Self-pay

## 2023-06-18 DIAGNOSIS — Z9889 Other specified postprocedural states: Secondary | ICD-10-CM

## 2023-06-22 ENCOUNTER — Ambulatory Visit: Payer: Managed Care, Other (non HMO) | Admitting: Physical Therapy

## 2023-06-26 ENCOUNTER — Ambulatory Visit: Payer: Managed Care, Other (non HMO) | Attending: Obstetrics | Admitting: Physical Therapy

## 2023-06-26 DIAGNOSIS — O26892 Other specified pregnancy related conditions, second trimester: Secondary | ICD-10-CM | POA: Insufficient documentation

## 2023-06-26 DIAGNOSIS — M62838 Other muscle spasm: Secondary | ICD-10-CM | POA: Insufficient documentation

## 2023-06-26 DIAGNOSIS — R102 Pelvic and perineal pain: Secondary | ICD-10-CM | POA: Insufficient documentation

## 2023-06-26 DIAGNOSIS — M6281 Muscle weakness (generalized): Secondary | ICD-10-CM | POA: Insufficient documentation

## 2023-06-26 DIAGNOSIS — R278 Other lack of coordination: Secondary | ICD-10-CM | POA: Insufficient documentation

## 2023-07-06 ENCOUNTER — Encounter: Payer: Managed Care, Other (non HMO) | Admitting: Physical Therapy

## 2023-07-06 DIAGNOSIS — O4400 Placenta previa specified as without hemorrhage, unspecified trimester: Secondary | ICD-10-CM | POA: Insufficient documentation

## 2023-07-06 DIAGNOSIS — Z981 Arthrodesis status: Secondary | ICD-10-CM | POA: Insufficient documentation

## 2023-07-06 DIAGNOSIS — R32 Unspecified urinary incontinence: Secondary | ICD-10-CM | POA: Insufficient documentation

## 2023-07-09 ENCOUNTER — Ambulatory Visit: Payer: Managed Care, Other (non HMO) | Admitting: *Deleted

## 2023-07-09 ENCOUNTER — Ambulatory Visit: Payer: Managed Care, Other (non HMO) | Attending: Obstetrics & Gynecology

## 2023-07-09 ENCOUNTER — Other Ambulatory Visit: Payer: Self-pay | Admitting: *Deleted

## 2023-07-09 ENCOUNTER — Encounter: Payer: Self-pay | Admitting: *Deleted

## 2023-07-09 VITALS — BP 117/67 | HR 89

## 2023-07-09 DIAGNOSIS — Z3A23 23 weeks gestation of pregnancy: Secondary | ICD-10-CM

## 2023-07-09 DIAGNOSIS — Z981 Arthrodesis status: Secondary | ICD-10-CM | POA: Insufficient documentation

## 2023-07-09 DIAGNOSIS — R32 Unspecified urinary incontinence: Secondary | ICD-10-CM

## 2023-07-09 DIAGNOSIS — O4400 Placenta previa specified as without hemorrhage, unspecified trimester: Secondary | ICD-10-CM | POA: Insufficient documentation

## 2023-07-09 DIAGNOSIS — O99892 Other specified diseases and conditions complicating childbirth: Secondary | ICD-10-CM | POA: Diagnosis not present

## 2023-07-09 DIAGNOSIS — O4402 Placenta previa specified as without hemorrhage, second trimester: Secondary | ICD-10-CM

## 2023-07-09 DIAGNOSIS — Z9889 Other specified postprocedural states: Secondary | ICD-10-CM | POA: Insufficient documentation

## 2023-07-09 NOTE — Progress Notes (Signed)
117/67 89 

## 2023-07-10 ENCOUNTER — Encounter: Payer: Managed Care, Other (non HMO) | Admitting: Physical Therapy

## 2023-07-13 ENCOUNTER — Encounter: Payer: Managed Care, Other (non HMO) | Admitting: Physical Therapy

## 2023-07-17 ENCOUNTER — Encounter: Payer: Managed Care, Other (non HMO) | Admitting: Physical Therapy

## 2023-07-22 ENCOUNTER — Encounter: Payer: Managed Care, Other (non HMO) | Admitting: Physical Therapy

## 2023-07-24 ENCOUNTER — Encounter: Payer: Managed Care, Other (non HMO) | Admitting: Physical Therapy

## 2023-08-13 ENCOUNTER — Other Ambulatory Visit: Payer: Self-pay | Admitting: Obstetrics & Gynecology

## 2023-08-13 ENCOUNTER — Ambulatory Visit: Payer: Managed Care, Other (non HMO)

## 2023-09-10 ENCOUNTER — Ambulatory Visit: Payer: Managed Care, Other (non HMO)

## 2023-09-21 ENCOUNTER — Other Ambulatory Visit: Payer: Self-pay | Admitting: Obstetrics & Gynecology

## 2023-09-29 NOTE — Patient Instructions (Signed)
Amy Hancock  09/29/2023   Your procedure is scheduled on:  10/13/2023  Arrive at 1100 at Entrance C on [address] at [facility]  and [employer]. You are invited to use the FREE valet parking or use the Visitor's parking deck.  Pick up the phone at the desk and dial [phone].  Call this number if you have problems the morning of surgery: [phone]  Remember:   Do not eat food:(After Midnight) Desps de medianoche.  You may drink clear liquids until arrival at __1100___.  Clear liquids means a liquid you can see thru.  It can have color such as Cola or Kool aid.  Tea is OK and coffee as long as no milk or creamer of any kind.  Take these medicines the morning of surgery with A SIP OF WATER:  none   Do not wear jewelry, make-up or nail polish.  Do not wear lotions, powders, or perfumes. Do not wear deodorant.  Do not shave 48 hours prior to surgery.  Do not bring valuables to the hospital.  [facility] is not   responsible for any belongings or valuables brought to the hospital.  Contacts, dentures or bridgework may not be worn into surgery.  Leave suitcase in the car. After surgery it may be brought to your room.  For patients admitted to the hospital, checkout time is 11:00 AM the day of              discharge.      Please read over the following fact sheets that you were given:     Preparing for Surgery

## 2023-09-30 ENCOUNTER — Telehealth (HOSPITAL_COMMUNITY): Payer: Self-pay | Admitting: *Deleted

## 2023-09-30 ENCOUNTER — Encounter (HOSPITAL_COMMUNITY): Payer: Self-pay

## 2023-09-30 NOTE — Telephone Encounter (Signed)
Preadmission screen  

## 2023-10-01 ENCOUNTER — Encounter (HOSPITAL_COMMUNITY): Payer: Self-pay

## 2023-10-03 ENCOUNTER — Encounter (HOSPITAL_COMMUNITY): Payer: Self-pay | Admitting: Obstetrics and Gynecology

## 2023-10-03 ENCOUNTER — Inpatient Hospital Stay (HOSPITAL_COMMUNITY)
Admission: AD | Admit: 2023-10-03 | Discharge: 2023-10-03 | Disposition: A | Discharge status: Home / Self Care | Payer: Managed Care, Other (non HMO) | Attending: Obstetrics and Gynecology | Admitting: Obstetrics and Gynecology

## 2023-10-03 ENCOUNTER — Inpatient Hospital Stay (HOSPITAL_COMMUNITY): Payer: Managed Care, Other (non HMO)

## 2023-10-03 DIAGNOSIS — O26893 Other specified pregnancy related conditions, third trimester: Secondary | ICD-10-CM | POA: Insufficient documentation

## 2023-10-03 DIAGNOSIS — M545 Low back pain, unspecified: Secondary | ICD-10-CM | POA: Insufficient documentation

## 2023-10-03 DIAGNOSIS — Z3A35 35 weeks gestation of pregnancy: Secondary | ICD-10-CM | POA: Insufficient documentation

## 2023-10-03 DIAGNOSIS — O26899 Other specified pregnancy related conditions, unspecified trimester: Secondary | ICD-10-CM

## 2023-10-03 DIAGNOSIS — Z3689 Encounter for other specified antenatal screening: Secondary | ICD-10-CM

## 2023-10-03 LAB — URINALYSIS, ROUTINE W REFLEX MICROSCOPIC
Bilirubin Urine: NEGATIVE
Glucose, UA: NEGATIVE mg/dL
Hgb urine dipstick: NEGATIVE
Ketones, ur: 5 mg/dL — AB
Leukocytes,Ua: NEGATIVE
Nitrite: NEGATIVE
Protein, ur: NEGATIVE mg/dL
Specific Gravity, Urine: 1.004 — ABNORMAL LOW (ref 1.005–1.030)
pH: 6 (ref 5.0–8.0)

## 2023-10-03 LAB — CBC WITH DIFFERENTIAL/PLATELET
Abs Immature Granulocytes: 0.16 10*3/uL — ABNORMAL HIGH (ref 0.00–0.07)
Basophils Absolute: 0.1 10*3/uL (ref 0.0–0.1)
Basophils Relative: 0 %
Eosinophils Absolute: 0.1 10*3/uL (ref 0.0–0.5)
Eosinophils Relative: 1 %
HCT: 35 % — ABNORMAL LOW (ref 36.0–46.0)
Hemoglobin: 11.8 g/dL — ABNORMAL LOW (ref 12.0–15.0)
Immature Granulocytes: 1 %
Lymphocytes Relative: 17 %
Lymphs Abs: 2.3 10*3/uL (ref 0.7–4.0)
MCH: 31.6 pg (ref 26.0–34.0)
MCHC: 33.7 g/dL (ref 30.0–36.0)
MCV: 93.6 fL (ref 80.0–100.0)
Monocytes Absolute: 1.1 10*3/uL — ABNORMAL HIGH (ref 0.1–1.0)
Monocytes Relative: 8 %
Neutro Abs: 10.2 10*3/uL — ABNORMAL HIGH (ref 1.7–7.7)
Neutrophils Relative %: 73 %
Platelets: 204 10*3/uL (ref 150–400)
RBC: 3.74 MIL/uL — ABNORMAL LOW (ref 3.87–5.11)
RDW: 13.3 % (ref 11.5–15.5)
WBC: 14 10*3/uL — ABNORMAL HIGH (ref 4.0–10.5)
nRBC: 0 % (ref 0.0–0.2)

## 2023-10-03 LAB — LIPASE, BLOOD: Lipase: 37 U/L (ref 11–51)

## 2023-10-03 LAB — COMPREHENSIVE METABOLIC PANEL
ALT: 109 U/L — ABNORMAL HIGH (ref 0–44)
AST: 61 U/L — ABNORMAL HIGH (ref 15–41)
Albumin: 2.7 g/dL — ABNORMAL LOW (ref 3.5–5.0)
Alkaline Phosphatase: 89 U/L (ref 38–126)
Anion gap: 11 (ref 5–15)
BUN: 8 mg/dL (ref 6–20)
CO2: 19 mmol/L — ABNORMAL LOW (ref 22–32)
Calcium: 8.9 mg/dL (ref 8.9–10.3)
Chloride: 104 mmol/L (ref 98–111)
Creatinine, Ser: 0.43 mg/dL — ABNORMAL LOW (ref 0.44–1.00)
GFR, Estimated: >60 mL/min (ref >60)
Glucose, Bld: 97 mg/dL (ref 70–99)
Potassium: 3.7 mmol/L (ref 3.5–5.1)
Sodium: 134 mmol/L — ABNORMAL LOW (ref 135–145)
Total Bilirubin: 0.4 mg/dL (ref <1.2)
Total Protein: 5.9 g/dL — ABNORMAL LOW (ref 6.5–8.1)

## 2023-10-03 MED ORDER — OXYCODONE HCL 5 MG PO TABS
5.0000 mg | ORAL_TABLET | Freq: Four times a day (QID) | ORAL | Status: DC | PRN
  Administered 2023-10-03: 5 mg via ORAL
  Filled 2023-10-03: qty 1

## 2023-10-03 MED ORDER — CYCLOBENZAPRINE HCL 5 MG PO TABS
10.0000 mg | ORAL_TABLET | Freq: Once | ORAL | Status: AC
  Administered 2023-10-03: 10 mg via ORAL
  Filled 2023-10-03: qty 2

## 2023-10-03 MED ORDER — OXYCODONE HCL 5 MG PO TABS: 5.0000 mg | ORAL_TABLET | Freq: Four times a day (QID) | ORAL | 0 refills | Status: DC | PRN

## 2023-10-03 MED ORDER — ACETAMINOPHEN 500 MG PO TABS
1000.0000 mg | ORAL_TABLET | Freq: Once | ORAL | Status: AC
  Administered 2023-10-03: 1000 mg via ORAL
  Filled 2023-10-03: qty 2

## 2023-10-03 NOTE — MAU Note (Signed)
.  Amy Hancock is a 32 y.o. at [redacted]w[redacted]d here in MAU reporting: lower right sided back pain that started yesterday but intensified last night. She states that the pain will come in waves and kept her up last night. She also has had discomfort when she voids and pressure in her pelvis. Denies VB or LOF. +FM. Has had an increase in mucous-like discharge. Patient has a known placenta previa and is scheduled for a c/s on 11/19.   Pain score: 9 Vitals:   10/03/23 1607  BP: 117/67  Pulse: 100  Resp: 14  Temp: 97.8 F (36.6 C)  SpO2: 100%     FHT:149 Lab orders placed from triage:   UA

## 2023-10-03 NOTE — MAU Provider Note (Addendum)
LBP    S [name] is a 32 y.o. G1P0 pregnant female at [redacted]w[redacted]d who presents to MAU today with complaint of LBP.  Pt states R sided back pain that started 11/8 and intensified overnight. Comes in waves.  Has discomfort when she voids and describes pressure in the pelvis.  Denies VB, LOF.  Endorses +FM.  Has increase in mucous like discharge.  Known placenta previa and is scheduled for C/S 11/19. Has extensive hx of back surgery and hardware.   Receives care at [facility]. Prenatal records reviewed.  Pertinent items noted in HPI and remainder of comprehensive ROS otherwise negative.   O BP 117/67 (BP Location: Right Arm)   Pulse 100   Temp 97.8 F (36.6 C) (Oral)   Resp 14   Wt 89.6 kg   LMP 01/27/2023   SpO2 100%   BMI 31.88 kg/m  Physical Exam Vitals and nursing note reviewed.  Constitutional:      General: She is not in acute distress.    Appearance: Normal appearance. She is not ill-appearing.  HENT:     Head: Normocephalic and atraumatic.     Right Ear: External ear normal.     Left Ear: External ear normal.     Nose: Nose normal.     Mouth/Throat:     Mouth: Mucous membranes are moist.     Pharynx: Oropharynx is clear.  Eyes:     Extraocular Movements: Extraocular movements intact.     Conjunctiva/sclera: Conjunctivae normal.  Cardiovascular:     Rate and Rhythm: Normal rate and regular rhythm.     Heart sounds: No murmur heard.    No friction rub. No gallop.  Pulmonary:     Effort: Pulmonary effort is normal. No respiratory distress.     Breath sounds: Normal breath sounds.  Abdominal:     General: There is no distension.     Palpations: Abdomen is soft.     Tenderness: There is no abdominal tenderness. There is no right CVA tenderness or left CVA tenderness.     Comments: Gravid, slight grimace with R CVA testing   Musculoskeletal:        General: No swelling. Normal range of motion.     Cervical back: Normal range of motion.  Skin:    General: Skin is  warm and dry.  Neurological:     Mental Status: She is alert and oriented to person, place, and time. Mental status is at baseline.     Motor: No weakness.     Gait: Gait normal.  Psychiatric:        Mood and Affect: Mood normal.        Behavior: Behavior normal.        Thought Content: Thought content normal.        Judgment: Judgment normal.      MDM: MAU Course:  Initial exam concerning for potential nephrolithiasis so will assess urine and lab works with renal US to assess.    NST: 140bpm, moderate variability, +accels, no decels, no CTX  UA noninfectious, small ketones  CBC diff WBC 14, left shift, Hgb 11.8 CMP Glucose 97, AST 61*, ALT 109*, Tbili wnl, Cr 0.43  After review lab work I'm more concerned about gallstones and possible choledocholithiasis, asked patient and she states the pain did start after a greasy burger at dinner and does now endorse the thoracic R sided flank pain radiating to her RUQ (with questionable Murphy's sign) and epigastric area. Agreeable with  lipase and RUQ [place].   Lipase wnl  RUQ [place] pending at time of signout. Pt's husband states patient still in pain w/o relief from flexeril or tylenol.  Will give Oxycodone 5mg .   A&P: #[redacted] weeks gestation #R sided back pain   Discharge from MAU in stable condition with strict/usual precautions Follow up at [facility] as scheduled for ongoing prenatal care   Hessie Dibble, MD 10/03/2023 4:10 PM    Reassessment (9:03 PM) -[place] returns without significant findings.  -Provider to bedside to inform of findings. -Patient expresses frustrations with not knowing exact source of pain. -Support given. -Discussed following up on Tuesday with primary office. -Patient offered and accepts pain medication. -Rx for Oxycodone 5mg  sent to pharmacy on file.  -Precautions reviewed. -Encouraged to call primary office or return to MAU if symptoms worsen or with the onset of new symptoms. -Discharged to home in stable  condition.   Cherre Robins MSN, CNM Advanced Practice Provider, [facility] [employer] [facility]

## 2023-10-09 ENCOUNTER — Encounter (HOSPITAL_COMMUNITY)
Admission: RE | Admit: 2023-10-09 | Discharge: 2023-10-09 | Disposition: A | Discharge status: Home / Self Care | Payer: Managed Care, Other (non HMO) | Source: Ambulatory Visit | Attending: Obstetrics & Gynecology | Admitting: Obstetrics & Gynecology

## 2023-10-09 DIAGNOSIS — Z01812 Encounter for preprocedural laboratory examination: Secondary | ICD-10-CM | POA: Insufficient documentation

## 2023-10-09 LAB — CBC
HCT: 37.2 % (ref 36.0–46.0)
Hemoglobin: 12.3 g/dL (ref 12.0–15.0)
MCH: 31.2 pg (ref 26.0–34.0)
MCHC: 33.1 g/dL (ref 30.0–36.0)
MCV: 94.4 fL (ref 80.0–100.0)
Platelets: 241 10*3/uL (ref 150–400)
RBC: 3.94 MIL/uL (ref 3.87–5.11)
RDW: 13.3 % (ref 11.5–15.5)
WBC: 11 10*3/uL — ABNORMAL HIGH (ref 4.0–10.5)
nRBC: 0 % (ref 0.0–0.2)

## 2023-10-09 NOTE — H&P (Incomplete)
Amy Hancock is a 32 y.o. female presenting for primary c-section for placenta previa at 36.4 wks due to worsening liver enzymes and persistent right upper quadrant pain. Pt was scheduled for C/section at 37 wks for persistent previa and was rescheduled today due to recent events of severe RUQ pain at 35.6 wks, MAU visit and no gall stones on sono but elevated AST/ ALT that are getting worse.  She does not have preeclampsia based on nl BPs, no neural symptoms, normal urine p/c ratio. Bile acids normal.  Pt's health is complicated by severe scoliosis, surgeries in past and recent spine hardware removal prior to getting pregnant. She was advised to have C/section by spine/ neuro due to disc herniation at S levels and risk of getting worse but she needs C/section regardless due to persistent anterior complete previa.  She has not had any previa related bleed or admissions this pregnancy   Pt has severe scoliosis, did surgery with hardwear May'15. Burst fracture L2 from a minor fall 2019- needed nasal osteoporosis Rx. Most recent spine surgery- Op note in chart T4-L1, hardwear removed from one side and bone graft done, severe scarring- [id number]- Dr Sullivan Lone- Spine doc. S5 degenerating, has coccyx pain. Spine ortho advised not to have more kids. Pt is in pelvic floor PT for urinary incontinence.   G1, married, surprise pregnancy, took some Tramadol, Lyrica around conception. She transferred care to [facility] from another practice at 16 weeks. Primary Dr Juliene Pina [facility] - wnl, NIPT low risk, girl. Previa has persisted since anatomy sono. No bleeding or admissions.  Growth at 28 wks  EFW 2'9" 32% AC 29% Vx, AFI 13 cm , anterior complete previa  Growth at 32 wks- EFW 4'6" 57% AC 72% anterior previa persists. Last sono at 36 wks - previa persists, most of it extends anteriorly    OB History     Gravida  1   Para      Term      Preterm      AB      Living         SAB      IAB      Ectopic       Multiple      Live Births             Past Medical History:  Diagnosis Date   Anxiety    Depression    Placenta previa    Scoliosis    Seasonal allergies    Urinary tract infection 05/09/2017   Past Surgical History:  Procedure Laterality Date   hardware removal from spine  10/01/2022   partial removal   SPINAL FUSION  04/12/2014   WISDOM TOOTH EXTRACTION     Family History: family history includes Alcoholism in her paternal uncle; Arthritis in her maternal grandfather, maternal grandmother, mother, paternal grandfather, paternal grandmother, and paternal uncle; Bipolar disorder in her father; Colon cancer in her paternal grandfather; Diabetes in her paternal grandmother; Healthy in her brother; Hyperlipidemia in her maternal grandfather; Hypertension in her mother; Prostate cancer in her maternal grandfather; Stroke in her maternal grandfather. Social History:  reports that she has never smoked. She has never used smokeless tobacco. She reports current alcohol use. She reports that she does not use drugs.     Maternal Diabetes: No Genetic Screening: Normal Maternal Ultrasounds/Referrals: Normal Fetal Ultrasounds or other Referrals:  MFM saw her for consult for delivery planning  Maternal Substance Abuse:  No Significant Maternal Medications:  Meds include: Other:  oxycodone - rare use, Prilosec  Significant Maternal Lab Results:  Group B Strep negative Number of Prenatal Visits:greater than 3 verified prenatal visits Maternal Vaccinations:RSV: Given during pregnancy >/=14 days ago, TDap, and Flu Other Comments:  None  Review of Systems History   Last menstrual period 01/27/2023. Exam Physical Exam  Pulse 88   Temp 97.6 F (36.4 C)   Resp 16   Ht 5\' 6"  (1.676 m)   Wt 92.4 kg   LMP 01/27/2023   SpO2 97%   BMI 32.89 kg/m  A&O x 3, no acute distress. Pleasant HEENT neg, no thyromegaly Lungs CTA bilat CV RRR, S1S2 normal Abdo soft, non tender, non  acute Extr no edema/ tenderness Pelvic defer FHT  130s Toco none  Prenatal labs: initial prenatal labs at 1st visit at [facility]/ 04/24/23 ABO, Rh: --/--/A POS (11/15 1509) Antibody: NEG (11/15 1509) Rubella:  Immune RPR:   Neg HBsAg:   Neg HepC neg HIV:   Neg GBS:   Neg NIPT low risk AFP1 neg GLucola nl   Assessment/Plan: 32 yo G1 at 36.4 wks here for C-section for placenta previa, planned <37 wks due to persistent right upper quadrant pain and worsening LFTs.  T&C x 2 units  TXA at start of surgery after Ancef  Repeat CMP tomorrow   Risks/complications of surgery reviewed incl infection, bleeding, damage to internal organs including bladder, bowels, ureters, blood vessels, other risks from anesthesia, VTE and delayed complications of any surgery, complications in future surgery reviewed. Also discussed neonatal complications incl difficult delivery, laceration, vacuum assistance, TTN etc. Pt understands and agrees, all concerns addressed.   Risk of blood transfusions and possible hysterectomy dw pt     Robley Fries MD [facility]

## 2023-10-09 NOTE — Patient Instructions (Signed)
[name]  10/09/2023   Your procedure is scheduled on:  10/10/2023  Arrive at 0600 at Entrance C on [address] at [facility]  and [employer]. You are invited to use the FREE valet parking or use the Visitor's parking deck.  Pick up the phone at the desk and dial [phone].  Call this number if you have problems the morning of surgery: [phone]  Remember:   Do not eat food:(After Midnight) Desps de medianoche.  You may drink clear liquids until arrival at __0600___.  Clear liquids means a liquid you can see thru.  It can have color such as Cola or Kool aid.  Tea is OK and coffee as long as no milk or creamer of any kind.  Take these medicines the morning of surgery with A SIP OF WATER:  Lexapro as prescribed   Do not wear jewelry, make-up or nail polish.  Do not wear lotions, powders, or perfumes. Do not wear deodorant.  Do not shave 48 hours prior to surgery.  Do not bring valuables to the hospital.  [facility] is not   responsible for any belongings or valuables brought to the hospital.  Contacts, dentures or bridgework may not be worn into surgery.  Leave suitcase in the car. After surgery it may be brought to your room.  For patients admitted to the hospital, checkout time is 11:00 AM the day of              discharge.      Please read over the following fact sheets that you were given:     Preparing for Surgery

## 2023-10-10 ENCOUNTER — Inpatient Hospital Stay (HOSPITAL_COMMUNITY)
Admission: AD | Admit: 2023-10-10 | Discharge: 2023-10-13 | DRG: 787 | Disposition: A | Discharge status: Home / Self Care | Payer: Managed Care, Other (non HMO) | Attending: Obstetrics & Gynecology | Admitting: Obstetrics & Gynecology

## 2023-10-10 ENCOUNTER — Other Ambulatory Visit: Payer: Self-pay

## 2023-10-10 ENCOUNTER — Inpatient Hospital Stay (HOSPITAL_COMMUNITY): Payer: Managed Care, Other (non HMO) | Admitting: Anesthesiology

## 2023-10-10 ENCOUNTER — Encounter (HOSPITAL_COMMUNITY)
Admission: AD | Disposition: A | Discharge status: Home / Self Care | Payer: Self-pay | Source: Home / Self Care | Attending: Obstetrics & Gynecology

## 2023-10-10 ENCOUNTER — Encounter (HOSPITAL_COMMUNITY): Payer: Self-pay | Admitting: Obstetrics & Gynecology

## 2023-10-10 DIAGNOSIS — R7401 Elevation of levels of liver transaminase levels: Secondary | ICD-10-CM | POA: Diagnosis present

## 2023-10-10 DIAGNOSIS — Z823 Family history of stroke: Secondary | ICD-10-CM | POA: Diagnosis not present

## 2023-10-10 DIAGNOSIS — Z8249 Family history of ischemic heart disease and other diseases of the circulatory system: Secondary | ICD-10-CM

## 2023-10-10 DIAGNOSIS — Z3A36 36 weeks gestation of pregnancy: Secondary | ICD-10-CM

## 2023-10-10 DIAGNOSIS — Z98891 History of uterine scar from previous surgery: Secondary | ICD-10-CM

## 2023-10-10 DIAGNOSIS — Z833 Family history of diabetes mellitus: Secondary | ICD-10-CM

## 2023-10-10 DIAGNOSIS — O4403 Placenta previa specified as without hemorrhage, third trimester: Principal | ICD-10-CM | POA: Diagnosis present

## 2023-10-10 DIAGNOSIS — D62 Acute posthemorrhagic anemia: Secondary | ICD-10-CM | POA: Diagnosis not present

## 2023-10-10 DIAGNOSIS — O9081 Anemia of the puerperium: Secondary | ICD-10-CM | POA: Diagnosis not present

## 2023-10-10 DIAGNOSIS — Z981 Arthrodesis status: Secondary | ICD-10-CM | POA: Diagnosis not present

## 2023-10-10 DIAGNOSIS — O4400 Placenta previa specified as without hemorrhage, unspecified trimester: Principal | ICD-10-CM | POA: Diagnosis present

## 2023-10-10 DIAGNOSIS — Z818 Family history of other mental and behavioral disorders: Secondary | ICD-10-CM | POA: Diagnosis not present

## 2023-10-10 DIAGNOSIS — Z800 Family history of malignant neoplasm of digestive organs: Secondary | ICD-10-CM

## 2023-10-10 DIAGNOSIS — Z01812 Encounter for preprocedural laboratory examination: Secondary | ICD-10-CM | POA: Diagnosis not present

## 2023-10-10 DIAGNOSIS — O9902 Anemia complicating childbirth: Secondary | ICD-10-CM | POA: Diagnosis not present

## 2023-10-10 LAB — PREPARE RBC (CROSSMATCH)

## 2023-10-10 LAB — CBC
HCT: 32.5 % — ABNORMAL LOW (ref 36.0–46.0)
Hemoglobin: 11.2 g/dL — ABNORMAL LOW (ref 12.0–15.0)
MCH: 32.1 pg (ref 26.0–34.0)
MCHC: 34.5 g/dL (ref 30.0–36.0)
MCV: 93.1 fL (ref 80.0–100.0)
Platelets: 210 10*3/uL (ref 150–400)
RBC: 3.49 MIL/uL — ABNORMAL LOW (ref 3.87–5.11)
RDW: 13.2 % (ref 11.5–15.5)
WBC: 17 10*3/uL — ABNORMAL HIGH (ref 4.0–10.5)
nRBC: 0 % (ref 0.0–0.2)

## 2023-10-10 LAB — SYPHILIS: RPR W/REFLEX TO RPR TITER AND TREPONEMAL ANTIBODIES, TRADITIONAL SCREENING AND DIAGNOSIS ALGORITHM: RPR Ser Ql: NONREACTIVE

## 2023-10-10 LAB — ABO/RH: ABO/RH(D): A POS

## 2023-10-10 SURGERY — Surgical Case
Anesthesia: Spinal

## 2023-10-10 MED ORDER — DEXAMETHASONE SODIUM PHOSPHATE 10 MG/ML IJ SOLN
INTRAMUSCULAR | Status: DC | PRN
  Administered 2023-10-10: 10 mg via INTRAVENOUS

## 2023-10-10 MED ORDER — DIBUCAINE (PERIANAL) 1 % EX OINT: 1.0000 | TOPICAL_OINTMENT | CUTANEOUS | Status: DC | PRN

## 2023-10-10 MED ORDER — POVIDONE-IODINE 10 % EX SWAB
2.0000 | Freq: Once | CUTANEOUS | Status: AC
  Administered 2023-10-10: 2 via TOPICAL

## 2023-10-10 MED ORDER — ACETAMINOPHEN 10 MG/ML IV SOLN: 1000.0000 mg | Freq: Once | INTRAVENOUS | Status: DC | PRN

## 2023-10-10 MED ORDER — LORATADINE 10 MG PO TABS
10.0000 mg | ORAL_TABLET | Freq: Every day | ORAL | Status: DC
  Administered 2023-10-10 – 2023-10-13 (×4): 10 mg via ORAL
  Filled 2023-10-10 (×4): qty 1

## 2023-10-10 MED ORDER — FENTANYL CITRATE (PF) 100 MCG/2ML IJ SOLN: 25.0000 ug | INTRAMUSCULAR | Status: DC | PRN

## 2023-10-10 MED ORDER — DROPERIDOL 2.5 MG/ML IJ SOLN: 0.6250 mg | Freq: Once | INTRAMUSCULAR | Status: DC | PRN

## 2023-10-10 MED ORDER — ACETAMINOPHEN 500 MG PO TABS
1000.0000 mg | ORAL_TABLET | Freq: Four times a day (QID) | ORAL | Status: DC
  Administered 2023-10-11 – 2023-10-13 (×7): 1000 mg via ORAL
  Filled 2023-10-10 (×9): qty 2

## 2023-10-10 MED ORDER — DIPHENHYDRAMINE HCL 25 MG PO CAPS: 25.0000 mg | ORAL_CAPSULE | ORAL | Status: DC | PRN

## 2023-10-10 MED ORDER — ESCITALOPRAM OXALATE 10 MG PO TABS
10.0000 mg | ORAL_TABLET | Freq: Every day | ORAL | Status: DC
  Administered 2023-10-11 – 2023-10-13 (×3): 10 mg via ORAL
  Filled 2023-10-10 (×4): qty 1

## 2023-10-10 MED ORDER — SENNOSIDES-DOCUSATE SODIUM 8.6-50 MG PO TABS
2.0000 | ORAL_TABLET | Freq: Every day | ORAL | Status: DC
  Administered 2023-10-11 – 2023-10-13 (×3): 2 via ORAL
  Filled 2023-10-10 (×3): qty 2

## 2023-10-10 MED ORDER — COCONUT OIL OIL
1.0000 | TOPICAL_OIL | Status: DC | PRN
  Administered 2023-10-11: 1 via TOPICAL

## 2023-10-10 MED ORDER — ONDANSETRON HCL 4 MG/2ML IJ SOLN
INTRAMUSCULAR | Status: AC
  Filled 2023-10-10: qty 2

## 2023-10-10 MED ORDER — METHYLERGONOVINE MALEATE 0.2 MG/ML IJ SOLN
INTRAMUSCULAR | Status: DC | PRN
  Administered 2023-10-10: .2 mg via INTRAMUSCULAR

## 2023-10-10 MED ORDER — OXYCODONE HCL 5 MG PO TABS
5.0000 mg | ORAL_TABLET | ORAL | Status: DC | PRN
  Administered 2023-10-11: 5 mg via ORAL
  Filled 2023-10-10: qty 1

## 2023-10-10 MED ORDER — IBUPROFEN 600 MG PO TABS
600.0000 mg | ORAL_TABLET | Freq: Four times a day (QID) | ORAL | Status: DC
  Administered 2023-10-11 – 2023-10-13 (×10): 600 mg via ORAL
  Filled 2023-10-10 (×10): qty 1

## 2023-10-10 MED ORDER — ONDANSETRON HCL 4 MG/2ML IJ SOLN: 4.0000 mg | Freq: Three times a day (TID) | INTRAMUSCULAR | Status: DC | PRN

## 2023-10-10 MED ORDER — DIPHENHYDRAMINE HCL 50 MG/ML IJ SOLN: 12.5000 mg | INTRAMUSCULAR | Status: DC | PRN

## 2023-10-10 MED ORDER — NALOXONE HCL 0.4 MG/ML IJ SOLN: 0.4000 mg | INTRAMUSCULAR | Status: DC | PRN

## 2023-10-10 MED ORDER — SIMETHICONE 80 MG PO CHEW
80.0000 mg | CHEWABLE_TABLET | Freq: Three times a day (TID) | ORAL | Status: DC
  Administered 2023-10-10 – 2023-10-13 (×7): 80 mg via ORAL
  Filled 2023-10-10 (×7): qty 1

## 2023-10-10 MED ORDER — FENTANYL CITRATE (PF) 100 MCG/2ML IJ SOLN
INTRAMUSCULAR | Status: DC | PRN
  Administered 2023-10-10: 15 ug via INTRATHECAL

## 2023-10-10 MED ORDER — CEFAZOLIN SODIUM-DEXTROSE 2-4 GM/100ML-% IV SOLN
INTRAVENOUS | Status: AC
  Filled 2023-10-10: qty 100

## 2023-10-10 MED ORDER — SODIUM CHLORIDE 0.9% FLUSH: 3.0000 mL | INTRAVENOUS | Status: DC | PRN

## 2023-10-10 MED ORDER — MENTHOL 3 MG MT LOZG: 1.0000 | LOZENGE | OROMUCOSAL | Status: DC | PRN

## 2023-10-10 MED ORDER — MORPHINE SULFATE (PF) 0.5 MG/ML IJ SOLN
INTRAMUSCULAR | Status: DC | PRN
  Administered 2023-10-10: 150 ug via INTRATHECAL

## 2023-10-10 MED ORDER — FENTANYL CITRATE (PF) 100 MCG/2ML IJ SOLN
INTRAMUSCULAR | Status: AC
  Filled 2023-10-10: qty 2

## 2023-10-10 MED ORDER — ALBUMIN HUMAN 5 % IV SOLN: INTRAVENOUS | Status: DC | PRN

## 2023-10-10 MED ORDER — TRANEXAMIC ACID-NACL 1000-0.7 MG/100ML-% IV SOLN
INTRAVENOUS | Status: DC | PRN
  Administered 2023-10-10: 1000 mg via INTRAVENOUS

## 2023-10-10 MED ORDER — PANTOPRAZOLE SODIUM 40 MG PO TBEC
40.0000 mg | DELAYED_RELEASE_TABLET | Freq: Every day | ORAL | Status: DC
  Administered 2023-10-10 – 2023-10-13 (×4): 40 mg via ORAL
  Filled 2023-10-10 (×4): qty 1

## 2023-10-10 MED ORDER — MORPHINE SULFATE (PF) 0.5 MG/ML IJ SOLN
INTRAMUSCULAR | Status: AC
  Filled 2023-10-10: qty 10

## 2023-10-10 MED ORDER — DEXAMETHASONE SODIUM PHOSPHATE 10 MG/ML IJ SOLN
INTRAMUSCULAR | Status: AC
  Filled 2023-10-10: qty 1

## 2023-10-10 MED ORDER — OXYTOCIN-SODIUM CHLORIDE 30-0.9 UT/500ML-% IV SOLN
INTRAVENOUS | Status: DC | PRN
  Administered 2023-10-10: 300 mL via INTRAVENOUS

## 2023-10-10 MED ORDER — ACETAMINOPHEN 500 MG PO TABS: 1000.0000 mg | ORAL_TABLET | Freq: Four times a day (QID) | ORAL | Status: DC

## 2023-10-10 MED ORDER — OXYTOCIN-SODIUM CHLORIDE 30-0.9 UT/500ML-% IV SOLN
2.5000 [IU]/h | INTRAVENOUS | Status: AC
  Administered 2023-10-10: 2.5 [IU]/h via INTRAVENOUS
  Filled 2023-10-10: qty 500

## 2023-10-10 MED ORDER — OXYCODONE HCL 5 MG PO TABS
5.0000 mg | ORAL_TABLET | Freq: Once | ORAL | Status: DC | PRN
Start: 2023-10-10 — End: 2023-10-10

## 2023-10-10 MED ORDER — MEPERIDINE HCL 25 MG/ML IJ SOLN: 6.2500 mg | INTRAMUSCULAR | Status: DC | PRN

## 2023-10-10 MED ORDER — DIPHENHYDRAMINE HCL 25 MG PO CAPS: 25.0000 mg | ORAL_CAPSULE | Freq: Four times a day (QID) | ORAL | Status: DC | PRN

## 2023-10-10 MED ORDER — LACTATED RINGERS IV SOLN: INTRAVENOUS | Status: DC

## 2023-10-10 MED ORDER — NALOXONE HCL 4 MG/10ML IJ SOLN: 1.0000 ug/kg/h | INTRAVENOUS | Status: DC | PRN

## 2023-10-10 MED ORDER — ZOLPIDEM TARTRATE 5 MG PO TABS: 5.0000 mg | ORAL_TABLET | Freq: Every evening | ORAL | Status: DC | PRN

## 2023-10-10 MED ORDER — PHENYLEPHRINE HCL (PRESSORS) 10 MG/ML IV SOLN
INTRAVENOUS | Status: DC | PRN
  Administered 2023-10-10: 80 ug via INTRAVENOUS

## 2023-10-10 MED ORDER — ONDANSETRON HCL 4 MG/2ML IJ SOLN
INTRAMUSCULAR | Status: DC | PRN
  Administered 2023-10-10: 4 mg via INTRAVENOUS

## 2023-10-10 MED ORDER — PRENATAL MULTIVITAMIN CH
1.0000 | ORAL_TABLET | Freq: Every day | ORAL | Status: DC
  Administered 2023-10-10 – 2023-10-13 (×4): 1 via ORAL
  Filled 2023-10-10 (×4): qty 1

## 2023-10-10 MED ORDER — CEFAZOLIN SODIUM-DEXTROSE 2-4 GM/100ML-% IV SOLN
2.0000 g | INTRAVENOUS | Status: AC
  Administered 2023-10-10 (×2): 2 g via INTRAVENOUS

## 2023-10-10 MED ORDER — ALBUMIN HUMAN 5 % IV SOLN
INTRAVENOUS | Status: AC
  Filled 2023-10-10: qty 250

## 2023-10-10 MED ORDER — BUPIVACAINE IN DEXTROSE 0.75-8.25 % IT SOLN
INTRATHECAL | Status: DC | PRN
  Administered 2023-10-10: 1.7 mL via INTRATHECAL

## 2023-10-10 MED ORDER — SCOPOLAMINE 1 MG/3DAYS TD PT72: 1.0000 | MEDICATED_PATCH | Freq: Once | TRANSDERMAL | Status: DC

## 2023-10-10 MED ORDER — KETOROLAC TROMETHAMINE 30 MG/ML IJ SOLN
30.0000 mg | Freq: Four times a day (QID) | INTRAMUSCULAR | Status: AC
  Administered 2023-10-10 – 2023-10-11 (×3): 30 mg via INTRAVENOUS
  Filled 2023-10-10 (×3): qty 1

## 2023-10-10 MED ORDER — WITCH HAZEL-GLYCERIN EX PADS: 1.0000 | MEDICATED_PAD | CUTANEOUS | Status: DC | PRN

## 2023-10-10 MED ORDER — METHYLERGONOVINE MALEATE 0.2 MG PO TABS
0.2000 mg | ORAL_TABLET | Freq: Four times a day (QID) | ORAL | Status: AC
  Administered 2023-10-10 – 2023-10-11 (×3): 0.2 mg via ORAL
  Filled 2023-10-10 (×3): qty 1

## 2023-10-10 MED ORDER — OXYCODONE HCL 5 MG/5ML PO SOLN: 5.0000 mg | Freq: Once | ORAL | Status: DC | PRN

## 2023-10-10 MED ORDER — PHENYLEPHRINE HCL-NACL 20-0.9 MG/250ML-% IV SOLN
INTRAVENOUS | Status: DC | PRN
  Administered 2023-10-10: 60 ug/min via INTRAVENOUS

## 2023-10-10 MED ORDER — SIMETHICONE 80 MG PO CHEW: 80.0000 mg | CHEWABLE_TABLET | ORAL | Status: DC | PRN

## 2023-10-10 SURGICAL SUPPLY — 37 items
BENZOIN TINCTURE PRP APPL 2/3 (GAUZE/BANDAGES/DRESSINGS) IMPLANT
CHLORAPREP W/TINT 26 (MISCELLANEOUS) ×2 IMPLANT
CLAMP UMBILICAL CORD (MISCELLANEOUS) ×1 IMPLANT
CLOTH BEACON ORANGE TIMEOUT ST (SAFETY) ×1 IMPLANT
DRSG OPSITE POSTOP 4X10 (GAUZE/BANDAGES/DRESSINGS) ×1 IMPLANT
ELECT REM PT RETURN 9FT ADLT (ELECTROSURGICAL) ×1
ELECTRODE REM PT RTRN 9FT ADLT (ELECTROSURGICAL) ×1 IMPLANT
EXTRACTOR VACUUM KIWI (MISCELLANEOUS) IMPLANT
EXTRACTOR VACUUM M CUP 4 TUBE (SUCTIONS) IMPLANT
GAUZE SPONGE 4X4 12PLY STRL LF (GAUZE/BANDAGES/DRESSINGS) IMPLANT
GLOVE BIO SURGEON STRL SZ7 (GLOVE) ×1 IMPLANT
GLOVE BIOGEL PI IND STRL 7.0 (GLOVE) ×1 IMPLANT
GOWN STRL REUS W/TWL LRG LVL3 (GOWN DISPOSABLE) ×2 IMPLANT
KIT ABG SYR 3ML LUER SLIP (SYRINGE) IMPLANT
NDL HYPO 25X5/8 SAFETYGLIDE (NEEDLE) IMPLANT
NEEDLE HYPO 25X5/8 SAFETYGLIDE (NEEDLE) IMPLANT
NS IRRIG 1000ML POUR BTL (IV SOLUTION) ×1 IMPLANT
PACK C SECTION WH (CUSTOM PROCEDURE TRAY) ×1 IMPLANT
PAD OB MATERNITY 4.3X12.25 (PERSONAL CARE ITEMS) ×1 IMPLANT
RTRCTR C-SECT PINK 25CM LRG (MISCELLANEOUS) IMPLANT
STRIP CLOSURE SKIN 1/2X4 (GAUZE/BANDAGES/DRESSINGS) IMPLANT
SUT MNCRL 0 VIOLET CTX 36 (SUTURE) ×2 IMPLANT
SUT PLAIN 0 NONE (SUTURE) IMPLANT
SUT PLAIN 2 0 (SUTURE)
SUT PLAIN ABS 2-0 CT1 27XMFL (SUTURE) IMPLANT
SUT VIC AB 0 CT1 27 (SUTURE) ×2
SUT VIC AB 0 CT1 27XBRD ANBCTR (SUTURE) ×2 IMPLANT
SUT VIC AB 2-0 CT1 27 (SUTURE) ×1
SUT VIC AB 2-0 CT1 TAPERPNT 27 (SUTURE) ×1 IMPLANT
SUT VIC AB 2-0 SH 27 (SUTURE) ×2
SUT VIC AB 2-0 SH 27XBRD (SUTURE) ×2 IMPLANT
SUT VIC AB 4-0 KS 27 (SUTURE) ×1 IMPLANT
SUT VIC AB CT1 27XBRD ANBCTRL (SUTURE) ×2
SUT VICRYL 0 TIES 12 18 (SUTURE) IMPLANT
TOWEL OR 17X24 6PK STRL BLUE (TOWEL DISPOSABLE) ×1 IMPLANT
TRAY FOLEY W/BAG SLVR 14FR LF (SET/KITS/TRAYS/PACK) IMPLANT
WATER STERILE IRR 1000ML POUR (IV SOLUTION) ×1 IMPLANT

## 2023-10-10 NOTE — Op Note (Signed)
Cesarean Section Procedure Note   Elon Weith  10/10/2023  Indications:  Anterior complete placenta previa.   Worsening elevated liver enzymes with right upper quadrant pain 36.4 weeks   Pre-operative Diagnosis: Placenta Previa.   Post-operative Diagnosis: Same   Surgeon:  Shea Evans, MD    Assistants: Marlene Bast MD   Anesthesia: spinal   Procedure Details:  The patient was seen in the Holding Room. The risks, benefits, complications, treatment options, and expected outcomes were discussed with the patient. The patient concurred with the proposed plan, giving informed consent. identified as Macee Krauss and the procedure verified as C-Section Delivery. A Time Out was held and the above information confirmed. After induction of anesthesia, the patient was draped and prepped in the usual sterile manner, foley was draining urine well. 2 gm Ancef given followed by TXA 1000mg .  A pfannenstiel incision was made and carried down through the subcutaneous tissue to the fascia. Fascial incision was made and extended transversely. The fascia was separated from the underlying rectus tissue superiorly and inferiorly. The peritoneum was identified and entered. Peritoneal incision was extended longitudinally. Alexis-O retractor placed. Uterus was palpated and assessed for placenta and lateral vascularity. Decision was made to proceed with low transverse hysterotomy. Bladder flap was not created separately. A low transverse uterine incision was made. Placenta was encountered as expected. Baby was reached through the placenta and amniotomy done followed by cephalic delivery of baby girl with good cry after stimulation at the field. Apgar scores of 6 at one minute and 8 at five minutes. Delayed cord clamping done at 1 minute and baby handed to NICU team in attendance. Cord ph was not sent. Cord blood was obtained for evaluation. The placenta was removed Intact and appeared normal. The uterine outline,  tubes and ovaries appeared normal Lower segment vascularity was actively bleeding. Three figure of eight stitches placed to control bleeding from placenta base in lower segment. Methergine 0.2 mg IM given,  Then uterine incision was closed with running locked sutures of in single layer since hemostasis was noted. Alexis retractor removed. Peritoneal closure deferred. The fascia was then reapproximated with running sutures of 0Vicryl. The subcuticular closure was performed using 2-0plain gut. The skin was closed with 4-0Vicryl.  Steristrips, honeycomb dressing placed.  Instrument, sponge, and needle counts were correct prior the abdominal closure and were correct at the conclusion of the case.   Findings: female infant delivered from Ken Caryl hysterotomy through placenta. Apgars 6,8.    Estimated Blood Loss: 1600 cc    Total IV Fluids: 2600 ml LR , 1 albumin   Urine Output: 200 cc clear   Specimens: cord blood   Complications:  hemorrhage due to previa   Disposition: PACU - hemodynamically stable.   Maternal Condition: stable   Baby condition / location:  Couplet care / Skin to Skin  Attending Attestation: I performed the procedure.   Signed: Surgeon(s): Shea Evans, MD

## 2023-10-10 NOTE — Anesthesia Preprocedure Evaluation (Addendum)
Anesthesia Evaluation  Patient identified by MRN, date of birth, ID band Patient awake    Reviewed: Allergy & Precautions, H&P , NPO status , Patient's Chart, lab work & pertinent test results  Airway Mallampati: II  TM Distance: >3 FB Neck ROM: Full    Dental no notable dental hx.    Pulmonary neg pulmonary ROS   Pulmonary exam normal breath sounds clear to auscultation       Cardiovascular negative cardio ROS Normal cardiovascular exam Rhythm:Regular Rate:Normal     Neuro/Psych  PSYCHIATRIC DISORDERS Anxiety Depression    negative neurological ROS     GI/Hepatic negative GI ROS, Neg liver ROS,,,  Endo/Other  negative endocrine ROS    Renal/GU negative Renal ROS  negative genitourinary   Musculoskeletal negative musculoskeletal ROS (+)    Abdominal   Peds negative pediatric ROS (+)  Hematology negative hematology ROS (+)   Anesthesia Other Findings Placenta previa Hx of scoliosis s/p multiple back surgeries and fusions  Reproductive/Obstetrics (+) Pregnancy                             Anesthesia Physical Anesthesia Plan  ASA: 2  Anesthesia Plan: Spinal   Post-op Pain Management:    Induction:   PONV Risk Score and Plan: Treatment may vary due to age or medical condition  Airway Management Planned: Natural Airway  Additional Equipment:   Intra-op Plan:   Post-operative Plan:   Informed Consent: I have reviewed the patients History and Physical, chart, labs and discussed the procedure including the risks, benefits and alternatives for the proposed anesthesia with the patient or authorized representative who has indicated his/her understanding and acceptance.     Dental advisory given  Plan Discussed with: CRNA  Anesthesia Plan Comments: Amy Hancock is a 32 y.o. female presenting for primary c-section for placenta previa at 36.4 wks due to worsening liver enzymes  and persistent right upper quadrant pain. Pt was scheduled for C/section at 37 wks for persistent previa and was rescheduled today due to recent events of severe RUQ pain at 35.6 wks, MAU visit and no gall stones on sono but elevated AST/ ALT that are getting worse.  She does not have preeclampsia based on nl BPs, no neural symptoms, normal urine p/c ratio. Bile acids normal.  Pt's health is complicated by severe scoliosis, surgeries in past and recent spine hardware removal prior to getting pregnant. She was advised to have C/section by spine/ neuro due to disc herniation at S levels and risk of getting worse but she needs C/section regardless due to persistent anterior complete previa.  She has not had any previa related bleed or admissions this pregnancy    Pt has severe scoliosis, did surgery with hardwear May'15. Burst fracture L2 from a minor fall 2019- needed nasal osteoporosis Rx. Most recent spine surgery- Op note in chart T4-L1, hardwear removed from one side and bone graft done, severe scarring- [id number]'[id number]- Dr Sullivan Lone- Spine doc. S5 degenerating, has coccyx pain. Spine ortho advised not to have more kids. Pt is in pelvic floor PT for urinary incontinence.  ")        Anesthesia Quick Evaluation

## 2023-10-10 NOTE — Transfer of Care (Signed)
Immediate Anesthesia Transfer of Care Note  Patient: Amy Hancock  Procedure(s) Performed: Primary CESAREAN SECTION  Patient Location: PACU  Anesthesia Type:Spinal  Level of Consciousness: awake, alert , and oriented  Airway & Oxygen Therapy: Patient Spontanous Breathing  Post-op Assessment: Report given to RN and Post -op Vital signs reviewed and stable  Post vital signs: Reviewed and stable  Last Vitals:  Vitals Value Taken Time  BP 111/96 10/10/23 0945  Temp    Pulse 84 10/10/23 0947  Resp 14 10/10/23 0947  SpO2 97 % 10/10/23 0947  Vitals shown include unfiled device data.  Last Pain:  Vitals:   10/10/23 0618  PainSc: 6       Patients Stated Pain Goal: 8 (10/10/23 0618)  Complications: No notable events documented.

## 2023-10-10 NOTE — Anesthesia Postprocedure Evaluation (Signed)
Anesthesia Post Note  Patient: Amy Hancock  Procedure(s) Performed: Primary CESAREAN SECTION     Patient location during evaluation: PACU Anesthesia Type: Spinal Level of consciousness: oriented and awake and alert Pain management: pain level controlled Vital Signs Assessment: post-procedure vital signs reviewed and stable Respiratory status: spontaneous breathing, respiratory function stable and patient connected to nasal cannula oxygen Cardiovascular status: blood pressure returned to baseline and stable Postop Assessment: no headache, no backache and no apparent nausea or vomiting Anesthetic complications: no   No notable events documented.  Last Vitals:  Vitals:   10/10/23 0945 10/10/23 1000  BP: (!) 111/96 101/67  Pulse: 83 86  Resp: 18 14  Temp: 36.4 C   SpO2: 100% 97%    Last Pain:  Vitals:   10/10/23 1000  TempSrc:   PainSc: 0-No pain    LLE Motor Response: Purposeful movement (10/10/23 1000) LLE Sensation: Tingling (10/10/23 1000) RLE Motor Response: Purposeful movement (10/10/23 1000) RLE Sensation: Tingling (10/10/23 1000)      [place]

## 2023-10-10 NOTE — Lactation Note (Signed)
This note was copied from a baby's chart. Lactation Consultation Note  Patient Name: Amy Hancock [id number] Date: 10/10/2023 Age:32 hours Reason for consult: Follow-up assessment;Mother's request;Primapara;1st time breastfeeding;Late-preterm 34-36.6wks;Infant < 6lbs;Breastfeeding assistance  P1- Infant was born at 36w 4d, weighing 2710 g. MOB requested assistance with latching infant to the breast. MOB understands the LPI/Low birth feeding plan given to her earlier. MOB has been pumping before trying to latch infant so she has colostrum to offer infant. MOB has been supplementing with DBM as well. MOB/FOB did not want infant to have nipple confusion, so they have used a curved tip syringe to supplement infant instead of a bottle. Per FOB, infant has been coughing and choking while eating with the syringe. LC reviewed how at this gestation, they may struggle with the suck, swallow, breathe aspect of eating, so the syringe may not be a good choice for her. MOB/FOB agreed. LC offered to try the new Neotech Bridge 20 mm nipple shield SNS with them to see if this is a safer way to feed infant without offering a bottle. MOB/FOB agreed.  LC reviewed how to use this SNS, how to place it, and how to clean it. The nipple shield part fit MOB's nipple perfectly and infant attempted to latch on immediately. LC demonstrated how to twist the filled syringe in the tubing and how to very slowly prime the tubing for infant to taste the milk. Infant would not suck on the nipple until she tasted the DBM. Infant had a strong nutritive suck for 20 minutes on the SNS nipple shield. Infant ate 2 mL EBM and 17 mL of DBM through the SNS. MOB/FOB were excited about this tool and requested to keep it for feedings. LC informed them that this is now their SNS and cannot be given to another patient.  LC reviewed the LPI/low birth feeding plan, CDC milk storage guidelines and LC services handout. LC encouraged MOB to call for  further assistance as needed.  Maternal Data Does the patient have breastfeeding experience prior to this delivery?: No  Feeding Mother's Current Feeding Choice: Breast Milk and Donor Milk  LATCH Score Latch: Grasps breast easily, tongue down, lips flanged, rhythmical sucking.  Audible Swallowing: Spontaneous and intermittent  Type of Nipple: Everted at rest and after stimulation  Comfort (Breast/Nipple): Soft / non-tender  Hold (Positioning): Assistance needed to correctly position infant at breast and maintain latch.  LATCH Score: 9   Lactation Tools Discussed/Used Tools: Pump;Flanges;Supplemental Nutrition System;Nipple Shields Nipple shield size: 20 (Neotech Bridge 20 mm nipple shield SNS) Flange Size: 21 Breast pump type: Double-Electric Breast Pump;Manual Pump Education: Setup, frequency, and cleaning;Milk Storage Reason for Pumping: LPI/Low birth weight Pumping frequency: 15-20 min every 2-3 hrs Pumped volume: 2 mL  Interventions Interventions: Breast feeding basics reviewed;Assisted with latch;Breast compression;Adjust position;Support pillows;Position options;Expressed milk;DEBP;Education;Pace feeding;LC Services brochure;LPT handout/interventions  Discharge Discharge Education: Warning signs for feeding baby;Outpatient recommendation Pump: DEBP;Personal (Spectra)  Consult Status Consult Status: Follow-up Date: 10/11/23 Follow-up type: In-patient    Dema Severin BS, IBCLC 10/10/2023, 10:35 PM

## 2023-10-11 LAB — COMPREHENSIVE METABOLIC PANEL
ALT: 84 U/L — ABNORMAL HIGH (ref 0–44)
AST: 48 U/L — ABNORMAL HIGH (ref 15–41)
Albumin: 2.2 g/dL — ABNORMAL LOW (ref 3.5–5.0)
Alkaline Phosphatase: 78 U/L (ref 38–126)
Anion gap: 7 (ref 5–15)
BUN: 7 mg/dL (ref 6–20)
CO2: 22 mmol/L (ref 22–32)
Calcium: 8.7 mg/dL — ABNORMAL LOW (ref 8.9–10.3)
Chloride: 103 mmol/L (ref 98–111)
Creatinine, Ser: 0.54 mg/dL (ref 0.44–1.00)
GFR, Estimated: >60 mL/min (ref >60)
Glucose, Bld: 158 mg/dL — ABNORMAL HIGH (ref 70–99)
Potassium: 3.4 mmol/L — ABNORMAL LOW (ref 3.5–5.1)
Sodium: 132 mmol/L — ABNORMAL LOW (ref 135–145)
Total Bilirubin: 0.2 mg/dL (ref <1.2)
Total Protein: 4.6 g/dL — ABNORMAL LOW (ref 6.5–8.1)

## 2023-10-11 LAB — URINALYSIS, ROUTINE W REFLEX MICROSCOPIC
Bacteria, UA: NONE SEEN
Bilirubin Urine: NEGATIVE
Glucose, UA: NEGATIVE mg/dL
Ketones, ur: NEGATIVE mg/dL
Nitrite: NEGATIVE
Protein, ur: NEGATIVE mg/dL
Specific Gravity, Urine: 1.002 — ABNORMAL LOW (ref 1.005–1.030)
pH: 8 (ref 5.0–8.0)

## 2023-10-11 LAB — CBC
HCT: 26.7 % — ABNORMAL LOW (ref 36.0–46.0)
Hemoglobin: 9.2 g/dL — ABNORMAL LOW (ref 12.0–15.0)
MCH: 31.9 pg (ref 26.0–34.0)
MCHC: 34.5 g/dL (ref 30.0–36.0)
MCV: 92.7 fL (ref 80.0–100.0)
Platelets: 203 10*3/uL (ref 150–400)
RBC: 2.88 MIL/uL — ABNORMAL LOW (ref 3.87–5.11)
RDW: 13 % (ref 11.5–15.5)
WBC: 16.3 10*3/uL — ABNORMAL HIGH (ref 4.0–10.5)
nRBC: 0 % (ref 0.0–0.2)

## 2023-10-11 MED ORDER — FERROUS SULFATE 325 (65 FE) MG PO TABS
325.0000 mg | ORAL_TABLET | Freq: Every day | ORAL | Status: DC
  Administered 2023-10-11 – 2023-10-13 (×3): 325 mg via ORAL
  Filled 2023-10-11 (×3): qty 1

## 2023-10-11 MED ORDER — PHENAZOPYRIDINE HCL 100 MG PO TABS
100.0000 mg | ORAL_TABLET | Freq: Three times a day (TID) | ORAL | Status: DC | PRN
  Administered 2023-10-11: 100 mg via ORAL
  Filled 2023-10-11 (×3): qty 1

## 2023-10-11 NOTE — Lactation Note (Addendum)
This note was copied from a baby's chart. Lactation Consultation Note  Patient Name: Amy Hancock Date: 10/11/2023 Age:32 hours Reason for consult: Follow-up assessment;1st time breastfeeding;Late-preterm 34-36.6wks;Infant < 6lbs;Infant weight loss  P1, Baby [redacted]w[redacted]d.  Weight loss 5.17%.  Baby has been breastfeeding with Neotech Bridge closed nipple shield SNS.  Most recent volumes consumed 20-21 ml.  Mother considering either having donor milk fortified or 22 kcal Neosure formula.  Mother is consistently pumping q 3 hours for 15 min.   Had mother prepump with hand pump.  Attempted latch in cross cradle.  Baby latched briefly.  Recommend offering breast at least once per day without nipple shield SNS. Applied bridge nipple shield SNS with 21 ml of donor milk. Baby opened wide and latched for 12 min taking 15 ml. Gave remaining 6 ml of donor milk in bottle with Nfant standard white nipple in side lying position. SLP to follow up today.  Plan: Keep baby STS as much as possible  Offer breast when baby cues that she is hungry, or awaken baby for feeding at 3 hrs. Consider alternating with bottle feeding with SNS.   Limit total feeding time to 30 mins so not to overtire baby. If baby does not latch after 10 min of attempt - give supplemental breastmilk/donor with Nfant bottle nipple.    Pump both breasts 15 minutes on initiation setting, adding hand expression to collect as much colostrum as possible to feed baby.  Feed baby supplemental ml EBM+/donor milk after breastfeeding per LPTI volume guidelines increasing per day of life and as baby desires.      Maternal Data Has patient been taught Hand Expression?: Yes  Feeding Mother's Current Feeding Choice: Breast Milk and Donor Milk  LATCH Score Latch: Grasps breast easily, tongue down, lips flanged, rhythmical sucking.  Audible Swallowing: A few with stimulation  Type of Nipple: Everted at rest and after  stimulation  Comfort (Breast/Nipple): Soft / non-tender  Hold (Positioning): Assistance needed to correctly position infant at breast and maintain latch.  LATCH Score: 8   Lactation Tools Discussed/Used Tools: Pump;Flanges Nipple shield size: 20 Flange Size: 21 Breast pump type: Double-Electric Breast Pump;Manual Reason for Pumping: stimulation and supplementation Pumping frequency: q 3 hours for 15 min Pumped volume: 5 mL  Interventions Interventions: Assisted with latch;Skin to skin;Pre-pump if needed;Support pillows;DEBP;Education;Pace feeding  Discharge Pump: Personal;DEBP (Spectra)  Consult Status Consult Status: Follow-up Date: 10/12/23 Follow-up type: In-patient    Dahlia Byes [facility] 10/11/2023, 1:27 PM

## 2023-10-11 NOTE — Lactation Note (Signed)
This note was copied from a baby's chart. Lactation Consultation Note  Patient Name: Amy Hancock [id number] Date: 10/11/2023 Age:33 hours Reason for consult: Initial assessment;1st time breastfeeding;Late-preterm 34-36.6wks;Infant < 6lbs  P1, Baby [redacted]w[redacted]d.  Called to room to assist with feeding due to BS of 34.  Mother had brought in 2 containers of colostrum ( 5 ml each) and had already spoon fed 5 ml to baby. Baby was skin to skin with mother.  Mother requested assistance with breastfeeding. Latch was attempted but baby did not open to feed.   Father stated some of colostrum had spilled when spoon feeding.  LC decided to use curved tip syringe and finger to give remaining colostrum to help stabilize BS.  Demonstrated how to finger syringe feed, eliciting suck first with finger and father was able to return demonstration.  Baby received additional 5 ml of colostrum.   Discussed LPI feeding behavior and reviewed volumes. Setup DEBP with 21 mm flanges and suggest mother pump q 3 hours for 15 min.  Left baby skin to skin on mother's chest.  Returned to room.  BS increased to 63.  Mother had pumped 15 ml with DEBP and had given 12 ml to baby with finger/syringe.  Baby had been in nursery due to temperature regulation and was sleeping.  LC unable at this time to assist with breastfeeding.  Donor milk consent signed.  Will discuss bottle feeding at later consult.  Suggest calling for help this evening as needed.    Maternal Data Has patient been taught Hand Expression?: Yes Does the patient have breastfeeding experience prior to this delivery?: No  Feeding Mother's Current Feeding Choice: Breast Milk and Donor Milk  Lactation Tools Discussed/Used Tools: Pump;Flanges Flange Size: 21 Breast pump type: Double-Electric Breast Pump;Manual Pump Education: Setup, frequency, and cleaning;Milk Storage Reason for Pumping: stimulation and supplementation Pumping frequency: q 3hours for 15 min Pumped  volume: 12 mL  Interventions Interventions: Breast feeding basics reviewed;Assisted with latch;Skin to skin;Hand express;DEBP;Education;LC Services brochure;LPT handout/interventions  Discharge Pump: Personal;DEBP (Spectra)  Consult Status Consult Status: Follow-up Date: 10/12/23 Follow-up type: In-patient    Hardie Pulley  RN, IBCLC 10/11/2023, 7:57 AM

## 2023-10-11 NOTE — Social Work (Signed)
MOB was referred for history of depression/anxiety.  * Referral screened out by Clinical Social Worker because none of the following criteria appear to apply:  ~ History of anxiety/depression during this pregnancy, or of post-partum depression following prior delivery.  ~ Diagnosis of anxiety and/or depression within last 3 years OR * MOB's symptoms currently being treated with medication and/or therapy. Per chart review MOB is currently prescribed and taking Zoloft.  Please contact the Clinical Social Worker if needs arise, by [facility] request, or if MOB scores greater than 9/yes to question 10 on Edinburgh Postpartum Depression Screen.  Wende Neighbors, LCSWA Clinical Social Worker [phone]

## 2023-10-11 NOTE — Progress Notes (Signed)
Postpartum Progress Note  POD#1 s/p PCS for placenta previa  S: Patient seen and examined at bedside. Reports feeling overall well. Pain well controlled, ambulating, tolerating regular diet, voiding without issue. Had bowel movement without issue. Denies fever, chills, chest pain, shortness of breath.  Feeding: plans to breastfeed Circ: N/A - female neonate  O:  Vitals:   10/11/23 0010 10/11/23 0540  BP: 96/62 97/61  Pulse: 69 79  Resp: 16 18  Temp: 97.8 F (36.6 C)   SpO2: 97% 98%    PE:  GA: well appearing, NAD CV: RRR, normal S1, S2 Lungs: CTAB Abd: soft, appropriately tender, fundus firm below umbilicus Inc: dressing in place, clean/dry/intact Peri: moderate lochia Ext: no TTP, +1 non-pitting edema  Labs:  Lab Results  Component Value Date   WBC 16.3 (H) 10/11/2023   HGB 9.2 (L) 10/11/2023   HCT 26.7 (L) 10/11/2023   MCV 92.7 10/11/2023   PLT 203 10/11/2023   Lab Results  Component Value Date   CREATININE 0.54 10/11/2023    A/P:  32 y.o.yo G1P0101 POD#1 s/p PCS for placenta previa (EBL 1600 mL) with transaminitis, severe scoliosis w/hx several spine surgeries, doing well and progressing appropriately. Vitals within normal limits. Physical exam benign. Labs notable for anemia, downtrending LFT's, otherwise normal. Plan as follows:  #Routine OB - Regular diet, HLIV - ERAS for pain control  - Rh positive - DVT ppx: ambulating and SCDs - BCM: Unsure  #Neonate -plans to breastfeed  # Transaminitis - Downtrending this morning - No RUQ pain  - Will continue to monitor   #Anemia - acute blood loss anemia - S/p 24h PO methergine series - Daily oral iron  Anticipate discharge home tomorrow.   Marlene Bast, MD

## 2023-10-11 NOTE — Lactation Note (Signed)
This note was copied from a baby's chart. Lactation Consultation Note  Patient Name: Amy Hancock [id number] Date: 10/11/2023 Age:32 hours Reason for consult: Follow-up assessment;Early term 37-38.6wks  P1, Baby [redacted]w[redacted]d.  Mother had questions about pumping and using new colostrum protectors.  Answered questions and praised mother for her efforts.   Suggest calling for LC to view next feeding.   Feeding Mother's Current Feeding Choice: Breast Milk and Donor Milk  Lactation Tools Discussed/Used Breast pump type: Double-Electric Breast Pump  Interventions Interventions: Breast feeding basics reviewed;DEBP  Discharge Pump: DEBP  Consult Status Consult Status: Follow-up Date: 10/11/23 Follow-up type: In-patient    Dahlia Byes [facility] 10/11/2023, 11:19 AM

## 2023-10-12 ENCOUNTER — Encounter (HOSPITAL_COMMUNITY)
Admission: RE | Admit: 2023-10-12 | Discharge: 2023-10-12 | Disposition: A | Discharge status: Home / Self Care | Payer: Managed Care, Other (non HMO) | Source: Ambulatory Visit | Attending: Obstetrics & Gynecology | Admitting: Obstetrics & Gynecology

## 2023-10-12 DIAGNOSIS — O9902 Anemia complicating childbirth: Secondary | ICD-10-CM | POA: Diagnosis not present

## 2023-10-12 HISTORY — DX: Complete placenta previa nos or without hemorrhage, unspecified trimester: O44.00

## 2023-10-12 LAB — COMPREHENSIVE METABOLIC PANEL
ALT: 74 U/L — ABNORMAL HIGH (ref 0–44)
AST: 39 U/L (ref 15–41)
Albumin: 2.2 g/dL — ABNORMAL LOW (ref 3.5–5.0)
Alkaline Phosphatase: 79 U/L (ref 38–126)
Anion gap: 8 (ref 5–15)
BUN: 10 mg/dL (ref 6–20)
CO2: 22 mmol/L (ref 22–32)
Calcium: 8.2 mg/dL — ABNORMAL LOW (ref 8.9–10.3)
Chloride: 105 mmol/L (ref 98–111)
Creatinine, Ser: 0.47 mg/dL (ref 0.44–1.00)
GFR, Estimated: >60 mL/min (ref >60)
Glucose, Bld: 80 mg/dL (ref 70–99)
Potassium: 3.8 mmol/L (ref 3.5–5.1)
Sodium: 135 mmol/L (ref 135–145)
Total Bilirubin: 0.3 mg/dL (ref <1.2)
Total Protein: 4.8 g/dL — ABNORMAL LOW (ref 6.5–8.1)

## 2023-10-12 MED ORDER — POLYETHYLENE GLYCOL 3350 17 G PO PACK
17.0000 g | PACK | Freq: Every day | ORAL | Status: DC
  Administered 2023-10-12 – 2023-10-13 (×2): 17 g via ORAL
  Filled 2023-10-12 (×2): qty 1

## 2023-10-12 NOTE — Lactation Note (Addendum)
This note was copied from a baby's chart. Lactation Consultation Note  Patient Name: Amy Hancock [id number] Date: 10/12/2023 Age:32 hours Reason for consult: Follow-up assessment;1st time breastfeeding;Late-preterm 34-36.6wks  P1, Baby [redacted]w[redacted]d PMA.  7.93% weight loss.  2.76% in the last 24 hours.  6 voids/4 stools in the last 24 hours.  Noted baby has tight mid anterior lingual frenulum with limited tongue protrusion. Suggest discussing with Ped MD.  Tongue restriction resource sheet given.  Baby consuming donor milk volume of 25-30 ml per feeding. Demonstrated how to use #20NS and prefilled with 2 ml of colostrum to latch baby as an option so baby will pull volume. Baby latched for 5 min on R breast and took 2 ml of colostrum volume that was in NS. Mother re-latched baby on L side with prefilled #20NS. Mother happy to be latching baby.  Father preparing bottle of donor milk for after breastfeeding session.   Discussed offering breast every other feeding and bottle with each feeding.  Email sent for OP Lactation.   Maternal Data Has patient been taught Hand Expression?: Yes Does the patient have breastfeeding experience prior to this delivery?: No  Feeding Mother's Current Feeding Choice: Breast Milk Nipple Type: Nfant Slow Flow (purple)  LATCH Score Latch: Grasps breast easily, tongue down, lips flanged, rhythmical sucking.  Audible Swallowing: A few with stimulation  Type of Nipple: Everted at rest and after stimulation  Comfort (Breast/Nipple): Filling, red/small blisters or bruises, mild/mod discomfort  Hold (Positioning): Assistance needed to correctly position infant at breast and maintain latch.  LATCH Score: 7   Lactation Tools Discussed/Used Tools: Pump;Flanges;Nipple Shields Nipple shield size: 20 Flange Size: 21 Breast pump type: Double-Electric Breast Pump;Manual Reason for Pumping: stimulation and supplementation Pumping frequency: q 3 hours 15 min Pumped  volume: 5 mL  Interventions Interventions: Assisted with latch;Skin to skin;Hand express;DEBP Consult Status Consult Status: Follow-up Date: 10/13/23 Follow-up type: In-patient    Dahlia Byes Boschen  RN, IBCLC 10/12/2023, 11:05 AM

## 2023-10-12 NOTE — Progress Notes (Signed)
Subjective: POD# 2 Live born female  Birth Weight: 5 lb 15.6 oz (2710 g) APGAR: 6, 8  Newborn Delivery   Birth date/time: 10/10/2023 08:48:00 Delivery type: C-Section, Low Transverse Trial of labor: No C-section categorization: Primary    Baby name: Lilah Delivering provider: MODY, VAISHALI  Feeding: breast and bottle  Pain control at delivery: Spinal  Reports feeling some pain but manageable.  Patient reports tolerating PO.   Breast symptoms:+ colostrum, working on latch Pain controlled with  PO meds Denies HA/SOB/C/P/N/V/dizziness. Flatus minimal. She reports vaginal bleeding as normal, without clots.  She is ambulating, urinating without difficulty.     Objective:   VS:    Vitals:   10/11/23 0540 10/11/23 1520 10/11/23 2148 10/12/23 0500  BP: 97/61 97/65 97/64  (!) 96/53  Pulse: 79 87 89 70  Resp: 18 18 18 16   Temp:  97.8 F (36.6 C) 98 F (36.7 C)   TempSrc:  Oral Oral   SpO2: 98%  97% 100%  Weight:      Height:        No intake or output data in the 24 hours ending 10/12/23 0937      Recent Labs    10/10/23 1408 10/11/23 0434  WBC 17.0* 16.3*  HGB 11.2* 9.2*  HCT 32.5* 26.7*  PLT 210 203      Latest Ref Rng & Units 10/12/2023    5:12 AM 10/11/2023    4:34 AM 10/03/2023    5:02 PM  CMP  Glucose 70 - 99 mg/dL 80  161  97   BUN 6 - 20 mg/dL 10  7  8    Creatinine 0.44 - 1.00 mg/dL 0.96  0.45  4.09   Sodium 135 - 145 mmol/L 135  132  134   Potassium 3.5 - 5.1 mmol/L 3.8  3.4  3.7   Chloride 98 - 111 mmol/L 105  103  104   CO2 22 - 32 mmol/L 22  22  19    Calcium 8.9 - 10.3 mg/dL 8.2  8.7  8.9   Total Protein 6.5 - 8.1 g/dL 4.8  4.6  5.9   Total Bilirubin <1.2 mg/dL 0.3  0.2  0.4   Alkaline Phos 38 - 126 U/L 79  78  89   AST 15 - 41 U/L 39  48  61   ALT 0 - 44 U/L 74  84  109       Blood type: --/--/A POS Performed at [facility], [address], [place], [place] [address]  [phone])  Rubella:   immune   Physical Exam:   General: alert, cooperative, and no distress Abdomen: soft, nontender, normal bowel sounds Incision: clean, dry, and intact Uterine Fundus: firm, below umbilicus, nontender Lochia: minimal Ext: no edema, redness or tenderness in the calves or thighs  Assessment/Plan: 32 y.o.   POD# 2. [id number]                  Principal Problem:   Postpartum care following cesarean delivery 11/16 Active Problems:   Placenta previa   Status post primary low transverse cesarean section   Maternal anemia, with delivery - ABL  - asymptomatic but clinically significant  - start Niferrex every other day and Mag Ox daily # Transaminitis - Downtrending  - No RUQ pain  - Will continue to monitor   Doing well, stable.  Advance diet as tolerated Encourage rest when baby rests Breastfeeding support Encourage to ambulate Routine post-op care  Neta Mends, CNM, MSN 10/12/2023, 9:37 AM

## 2023-10-13 LAB — TYPE AND SCREEN
ABO/RH(D): A POS
Antibody Screen: NEGATIVE
Unit division: 0
Unit division: 0

## 2023-10-13 LAB — BPAM RBC
Blood Product Expiration Date: 202412062359
Blood Product Expiration Date: 202412062359
ISSUE DATE / TIME: 202411160757
ISSUE DATE / TIME: 202411160757
Unit Type and Rh: 6200
Unit Type and Rh: 6200

## 2023-10-13 MED ORDER — ACETAMINOPHEN 500 MG PO TABS: 1000.0000 mg | ORAL_TABLET | Freq: Four times a day (QID) | ORAL | 0 refills | Status: AC

## 2023-10-13 MED ORDER — POLYSACCHARIDE IRON COMPLEX 150 MG PO CAPS: 150.0000 mg | ORAL_CAPSULE | ORAL | Status: AC

## 2023-10-13 MED ORDER — MAGNESIUM OXIDE -MG SUPPLEMENT 400 (240 MG) MG PO TABS: 400.0000 mg | ORAL_TABLET | Freq: Every day | ORAL | Status: AC

## 2023-10-13 MED ORDER — SENNOSIDES-DOCUSATE SODIUM 8.6-50 MG PO TABS: 2.0000 | ORAL_TABLET | Freq: Every day | ORAL | Status: AC

## 2023-10-13 MED ORDER — IBUPROFEN 600 MG PO TABS: 600.0000 mg | ORAL_TABLET | Freq: Four times a day (QID) | ORAL | 0 refills | Status: AC

## 2023-10-13 NOTE — Discharge Instructions (Signed)
Lactation outpatient support - home visit   Jessica Bowers, IBCLC (lactation consultant)  & Birth Doula  Phone (text or call): [phone] Email: [email or web address]   Linda Coppola RN, MHA, IBCLC at [facility]: Lactation Consultant  [email or web address] Mail: [email or web address] Tel: [phone]   Additional breastfeeding resources:  [facility] https://ibconline.ca/information-sheets/  [employer] [place]  www.[email or web address]   Other Resources:  Chiropractic specialist   Dr. Leanna Hastings [email or web address]   Craniosacral therapy for baby  Erin Balkind  [email or web address]  Pediatric Dentist (for tongue ties)  Dr. Kate Lambert Spangler, [facility]  Phone: [phone]  [address] [place]  [address] [email or web address]  

## 2023-10-13 NOTE — Discharge Summary (Signed)
OB Discharge Summary  Patient Name: Amy Hancock DOB: 02/10/1991 MRN: [id number]  Date of admission: 10/10/2023 Delivering provider: MODY, VAISHALI  Admitting diagnosis: Placenta previa [O44.00] Status post primary low transverse cesarean section [Z98.891] Intrauterine pregnancy: [redacted]w[redacted]d     Secondary diagnosis: Patient Active Problem List   Diagnosis Date Noted   Postpartum care following cesarean delivery 11/16 10/12/2023   Maternal anemia, with delivery - ABL 10/12/2023   Placenta previa 10/10/2023   Status post primary low transverse cesarean section 10/10/2023   History of lumbar spinal fusion 07/06/2023   Urinary incontinence 07/06/2023   Additional problems:none   Date of discharge: 10/13/2023   Discharge diagnosis: Principal Problem:   Postpartum care following cesarean delivery 11/16 Active Problems:   Placenta previa   Status post primary low transverse cesarean section   Maternal anemia, with delivery - ABL                                                              Post partum procedures: none  Augmentation: N/A Pain control: Spinal Laceration:None Episiotomy:None Complications: None  Hospital course:  Sceduled C/S   32 y.o. yo G1P0101 at [redacted]w[redacted]d was admitted to the hospital 10/10/2023 for scheduled cesarean section with the following indication: complete placenta previa and worsening liver enzymes .Delivery details are as follows:  Membrane Rupture Time/Date: 8:47 AM,10/10/2023  Delivery Method:C-Section, Low Transverse Operative Delivery:N/A Details of operation can be found in separate operative note.  Patient had a postpartum course complicated by none.  She is ambulating, tolerating a regular diet, passing flatus, and urinating well. Patient is discharged home in stable condition on  10/13/23, close follow up on Friday for liver enzymes levels.         Newborn Data: Birth date:10/10/2023 Birth time:8:48 AM Gender:Female Living  status:Living Apgars:6 ,8  Weight:2710 g    Physical exam  Vitals:   10/12/23 0500 10/12/23 1600 10/12/23 2100 10/13/23 0600  BP: (!) 96/53 107/63 103/64 99/66  Pulse: 70 85 89 86  Resp: 16 18 15 18   Temp:  97.8 F (36.6 C) 98.1 F (36.7 C) 97.7 F (36.5 C)  TempSrc:  Oral Oral Oral  SpO2: 100%  98% 98%  Weight:      Height:       General: alert, cooperative, and no distress Lochia: appropriate Uterine Fundus: firm Incision: Dressing is clean, dry, and intact DVT Evaluation: No cords or calf tenderness. No significant calf/ankle edema. Labs: Lab Results  Component Value Date   WBC 16.3 (H) 10/11/2023   HGB 9.2 (L) 10/11/2023   HCT 26.7 (L) 10/11/2023   MCV 92.7 10/11/2023   PLT 203 10/11/2023      Latest Ref Rng & Units 10/12/2023    5:12 AM  CMP  Glucose 70 - 99 mg/dL 80   BUN 6 - 20 mg/dL 10   Creatinine 7.82 - 1.00 mg/dL 9.56   Sodium 213 - 086 mmol/L 135   Potassium 3.5 - 5.1 mmol/L 3.8   Chloride 98 - 111 mmol/L 105   CO2 22 - 32 mmol/L 22   Calcium 8.9 - 10.3 mg/dL 8.2   Total Protein 6.5 - 8.1 g/dL 4.8   Total Bilirubin <5.7 mg/dL 0.3   Alkaline Phos 38 - 126 U/L 79  AST 15 - 41 U/L 39   ALT 0 - 44 U/L 74       10/12/2023    2:00 PM  Edinburgh Postnatal Depression Scale Screening Tool  I have been able to laugh and see the funny side of things. 0  I have looked forward with enjoyment to things. 1  I have blamed myself unnecessarily when things went wrong. 3  I have been anxious or worried for no good reason. 2  I have felt scared or panicky for no good reason. 1  Things have been getting on top of me. 1  I have been so unhappy that I have had difficulty sleeping. 0  I have felt sad or miserable. 1  I have been so unhappy that I have been crying. 0  The thought of harming myself has occurred to me. 0  Edinburgh Postnatal Depression Scale Total 9    Discharge instruction:  per After Visit Summary,  [facility] booklet and  "Understanding  Mother & Baby Care" hospital booklet After Visit Meds:  Allergies as of 10/13/2023   No Known Allergies      Medication List     STOP taking these medications    folic acid 800 MCG tablet Commonly known as: FOLVITE   omeprazole 20 MG capsule Commonly known as: PRILOSEC   oxyCODONE 5 MG immediate release tablet Commonly known as: Roxicodone       TAKE these medications    acetaminophen 500 MG tablet Commonly known as: TYLENOL Take 2 tablets (1,000 mg total) by mouth every 6 (six) hours. What changed:  when to take this reasons to take this   escitalopram 10 MG tablet Commonly known as: LEXAPRO Take 10 mg by mouth daily.   ibuprofen 600 MG tablet Commonly known as: ADVIL Take 1 tablet (600 mg total) by mouth every 6 (six) hours.   iron polysaccharides 150 MG capsule Commonly known as: Ferrex 150 Take 1 capsule (150 mg total) by mouth every other day.   loratadine 10 MG tablet Commonly known as: CLARITIN Take 10 mg by mouth daily.   magnesium oxide 400 (240 Mg) MG tablet Commonly known as: MAG-OX Take 1 tablet (400 mg total) by mouth daily. For prevention of constipation.   multivitamin-prenatal 27-0.8 MG Tabs tablet Take 1 tablet by mouth daily.   senna-docusate 8.6-50 MG tablet Commonly known as: Senokot-S Take 2 tablets by mouth daily.       Diet: iron rich diet Activity: Advance as tolerated. Pelvic rest for 6 weeks.  Postpartum contraception: TBA in office Newborn Data: Live born female  Birth Weight: 5 lb 15.6 oz (2710 g) APGAR: 6, 8  Newborn Delivery   Birth date/time: 10/10/2023 08:48:00 Delivery type: C-Section, Low Transverse Trial of labor: No C-section categorization: Primary     named Lilah Baby Feeding: Bottle and Breast Disposition:home with mother Delivery Report: Review the Delivery Report for details.   Follow up:  Follow-up Information     Obgyn, [facility] [employer] [facility]. Schedule an appointment as soon as possible for a visit on  10/16/2023.   Why: For Postpartum follow-up, labs Contact information: [address] [place] [address] [phone]                Signed: Cipriano Mile, MSN 10/13/2023, 9:17 AM

## 2023-10-13 NOTE — Lactation Note (Signed)
This note was copied from a baby's chart. Lactation Consultation Note  Patient Name: Amy Hancock [id number] Date: 10/13/2023 Age:32 hours Reason for consult: 1st time breastfeeding;Primapara;Late-preterm 34-36.6wks;Infant weight loss;Infant < 6lbs;Difficult latch (using a NS due to DL) As LC entered the room mom pumping with the DEBP with excellent results. Mom has been consistent with her pumping and total volume so far is approx 90 ml.  LC praised mom for her consistent pumping..  Per mom  has been using the NS to latch since the baby won't sustain a latch otherwise.  LC reviewed sore nipple and engorgement prevention and tx.  Per mom has a DEBP at home and plans to F/U with the [facility] LC or the OB's office LC associated with their group.  LC stressed the importance of feeding with cues and by 3 hours.     Maternal Data Has patient been taught Hand Expression?: Yes  Feeding Mother's Current Feeding Choice: Breast Milk and Donor Milk Nipple Type: Nfant Slow Flow (purple)      Lactation Tools Discussed/Used Tools: Pump;Flanges;Nipple Shields Nipple shield size: 20 Flange Size: 21 Breast pump type: Manual;Double-Electric Breast Pump Pump Education: Milk Storage  Interventions Interventions: Breast feeding basics reviewed;Shells;Hand pump;Education;LC Services brochure;LPT handout/interventions  Discharge Discharge Education: Engorgement and breast care;Warning signs for feeding baby Pump: Personal;DEBP;Manual  Consult Status Consult Status: Complete Date: 10/13/23    Kathrin Greathouse 10/13/2023, 1:53 PM

## 2023-10-20 ENCOUNTER — Telehealth (HOSPITAL_COMMUNITY): Payer: Self-pay

## 2023-10-20 NOTE — Telephone Encounter (Signed)
10/20/2023 1623  Name: Juliauna Baluch MRN: [id number] DOB: 08-20-1991  Reason for Call:  Transition of Care Hospital Discharge Call  Contact Status: Patient Contact Status: Message  Language assistant needed:          Follow-Up Questions:    Inocente Salles Postnatal Depression Scale:  In the Past 7 Days:    PHQ2-9 Depression Scale:     Discharge Follow-up:    Post-discharge interventions: NA  Signature  Signe Colt

## 2024-01-04 ENCOUNTER — Other Ambulatory Visit: Payer: Self-pay | Admitting: Orthopaedic Surgery

## 2024-01-04 DIAGNOSIS — M5116 Intervertebral disc disorders with radiculopathy, lumbar region: Secondary | ICD-10-CM

## 2024-01-11 ENCOUNTER — Encounter: Payer: Self-pay | Admitting: Orthopaedic Surgery

## 2024-01-21 ENCOUNTER — Other Ambulatory Visit: Payer: Self-pay
